# Patient Record
Sex: Male | Born: 1990 | Race: Black or African American | Hispanic: No | Marital: Single | Smoking: Current some day smoker
Health system: Southern US, Community
[De-identification: ages and names within clinical notes are randomized; demographics above are authoritative.]

## PROBLEM LIST (undated history)

## (undated) ENCOUNTER — Emergency Department (HOSPITAL_COMMUNITY): Admission: EM | Payer: Self-pay | Source: Home / Self Care

## (undated) DIAGNOSIS — J45909 Unspecified asthma, uncomplicated: Secondary | ICD-10-CM

## (undated) DIAGNOSIS — J40 Bronchitis, not specified as acute or chronic: Secondary | ICD-10-CM

---
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
## 2008-07-15 ENCOUNTER — Emergency Department (HOSPITAL_COMMUNITY): Admission: EM | Admit: 2008-07-15 | Discharge: 2008-07-15 | Payer: Self-pay | Admitting: Emergency Medicine

## 2010-04-08 ENCOUNTER — Emergency Department (HOSPITAL_COMMUNITY)
Admission: EM | Admit: 2010-04-08 | Discharge: 2010-04-08 | Disposition: A | Discharge status: Home / Self Care | Payer: Self-pay | Attending: Emergency Medicine | Admitting: Emergency Medicine

## 2010-04-08 ENCOUNTER — Emergency Department (HOSPITAL_COMMUNITY): Payer: Self-pay

## 2010-04-08 DIAGNOSIS — S62009A Unspecified fracture of navicular [scaphoid] bone of unspecified wrist, initial encounter for closed fracture: Secondary | ICD-10-CM | POA: Insufficient documentation

## 2012-04-29 ENCOUNTER — Emergency Department (HOSPITAL_COMMUNITY): Payer: No Typology Code available for payment source

## 2012-04-29 ENCOUNTER — Encounter (HOSPITAL_COMMUNITY): Payer: Self-pay

## 2012-04-29 ENCOUNTER — Emergency Department (HOSPITAL_COMMUNITY)
Admission: EM | Admit: 2012-04-29 | Discharge: 2012-04-29 | Disposition: A | Discharge status: Home / Self Care | Payer: No Typology Code available for payment source | Attending: Emergency Medicine | Admitting: Emergency Medicine

## 2012-04-29 DIAGNOSIS — T148XXA Other injury of unspecified body region, initial encounter: Secondary | ICD-10-CM | POA: Insufficient documentation

## 2012-04-29 DIAGNOSIS — F17200 Nicotine dependence, unspecified, uncomplicated: Secondary | ICD-10-CM | POA: Insufficient documentation

## 2012-04-29 DIAGNOSIS — S0083XA Contusion of other part of head, initial encounter: Secondary | ICD-10-CM

## 2012-04-29 DIAGNOSIS — Y939 Activity, unspecified: Secondary | ICD-10-CM | POA: Insufficient documentation

## 2012-04-29 DIAGNOSIS — J45909 Unspecified asthma, uncomplicated: Secondary | ICD-10-CM | POA: Insufficient documentation

## 2012-04-29 DIAGNOSIS — IMO0002 Reserved for concepts with insufficient information to code with codable children: Secondary | ICD-10-CM | POA: Insufficient documentation

## 2012-04-29 DIAGNOSIS — S0003XA Contusion of scalp, initial encounter: Secondary | ICD-10-CM | POA: Insufficient documentation

## 2012-04-29 DIAGNOSIS — Y92410 Unspecified street and highway as the place of occurrence of the external cause: Secondary | ICD-10-CM | POA: Insufficient documentation

## 2012-04-29 HISTORY — DX: Unspecified asthma, uncomplicated: J45.909

## 2012-04-29 MED ORDER — HYDROCODONE-ACETAMINOPHEN 5-325 MG PO TABS: 1.0000 | ORAL_TABLET | ORAL | Status: AC | PRN

## 2012-04-29 MED ORDER — HYDROCODONE-ACETAMINOPHEN 5-325 MG PO TABS
1.0000 | ORAL_TABLET | Freq: Once | ORAL | Status: AC
  Administered 2012-04-29: 1 via ORAL
  Filled 2012-04-29: qty 1

## 2012-04-29 MED ORDER — METHOCARBAMOL 500 MG PO TABS: 500.0000 mg | ORAL_TABLET | Freq: Three times a day (TID) | ORAL | Status: AC

## 2012-04-29 MED ORDER — METHOCARBAMOL 500 MG PO TABS
1000.0000 mg | ORAL_TABLET | Freq: Once | ORAL | Status: AC
  Administered 2012-04-29: 1000 mg via ORAL
  Filled 2012-04-29: qty 2

## 2012-04-29 MED ORDER — IBUPROFEN 800 MG PO TABS
800.0000 mg | ORAL_TABLET | Freq: Once | ORAL | Status: AC
  Administered 2012-04-29: 800 mg via ORAL
  Filled 2012-04-29: qty 1
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
## 2012-04-29 NOTE — ED Notes (Signed)
Pa bryant to see pt.  

## 2012-04-29 NOTE — ED Notes (Signed)
Pt resting in bed, awaiting xray results

## 2012-04-30 NOTE — ED Provider Notes (Signed)
Medical screening examination/treatment/procedure(s) were performed by non-physician practitioner and as supervising physician I was immediately available for consultation/collaboration.   Joya Gaskins, MD 04/30/12 [phone]

## 2013-08-12 ENCOUNTER — Emergency Department: Payer: Self-pay | Admitting: Emergency Medicine

## 2016-07-23 ENCOUNTER — Emergency Department (HOSPITAL_COMMUNITY)
Admission: EM | Admit: 2016-07-23 | Discharge: 2016-07-23 | Disposition: A | Discharge status: Home / Self Care | Payer: No Typology Code available for payment source | Attending: Emergency Medicine | Admitting: Emergency Medicine

## 2016-07-23 ENCOUNTER — Encounter (HOSPITAL_COMMUNITY): Payer: Self-pay | Admitting: *Deleted

## 2016-07-23 ENCOUNTER — Emergency Department (HOSPITAL_COMMUNITY): Payer: No Typology Code available for payment source

## 2016-07-23 DIAGNOSIS — S3992XA Unspecified injury of lower back, initial encounter: Secondary | ICD-10-CM | POA: Diagnosis present

## 2016-07-23 DIAGNOSIS — Y999 Unspecified external cause status: Secondary | ICD-10-CM | POA: Diagnosis not present

## 2016-07-23 DIAGNOSIS — Y939 Activity, unspecified: Secondary | ICD-10-CM | POA: Insufficient documentation

## 2016-07-23 DIAGNOSIS — M7918 Myalgia, other site: Secondary | ICD-10-CM

## 2016-07-23 DIAGNOSIS — M791 Myalgia: Secondary | ICD-10-CM | POA: Diagnosis not present

## 2016-07-23 DIAGNOSIS — F17200 Nicotine dependence, unspecified, uncomplicated: Secondary | ICD-10-CM | POA: Diagnosis not present

## 2016-07-23 DIAGNOSIS — Y92410 Unspecified street and highway as the place of occurrence of the external cause: Secondary | ICD-10-CM | POA: Diagnosis not present

## 2016-07-23 DIAGNOSIS — J45909 Unspecified asthma, uncomplicated: Secondary | ICD-10-CM | POA: Insufficient documentation

## 2016-07-23 DIAGNOSIS — S39012A Strain of muscle, fascia and tendon of lower back, initial encounter: Secondary | ICD-10-CM | POA: Diagnosis not present

## 2016-07-23 HISTORY — DX: Unspecified asthma, uncomplicated: J45.909

## 2016-07-23 HISTORY — DX: Bronchitis, not specified as acute or chronic: J40

## 2016-07-23 MED ORDER — IBUPROFEN 600 MG PO TABS: 600.0000 mg | ORAL_TABLET | Freq: Four times a day (QID) | ORAL | 0 refills | Status: DC | PRN

## 2016-07-23 MED ORDER — OXYCODONE-ACETAMINOPHEN 5-325 MG PO TABS
1.0000 | ORAL_TABLET | Freq: Once | ORAL | Status: AC
  Administered 2016-07-23: 1 via ORAL
  Filled 2016-07-23: qty 1

## 2016-07-23 MED ORDER — METHOCARBAMOL 500 MG PO TABS: 500.0000 mg | ORAL_TABLET | Freq: Two times a day (BID) | ORAL | 0 refills | Status: DC
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
## 2018-12-02 ENCOUNTER — Other Ambulatory Visit: Payer: Self-pay

## 2018-12-02 ENCOUNTER — Encounter (HOSPITAL_COMMUNITY): Payer: Self-pay | Admitting: Emergency Medicine

## 2018-12-02 ENCOUNTER — Emergency Department (HOSPITAL_COMMUNITY)
Admission: EM | Admit: 2018-12-02 | Discharge: 2018-12-02 | Disposition: A | Discharge status: Home / Self Care | Payer: Self-pay | Attending: Emergency Medicine | Admitting: Emergency Medicine

## 2018-12-02 DIAGNOSIS — S0181XA Laceration without foreign body of other part of head, initial encounter: Secondary | ICD-10-CM | POA: Insufficient documentation

## 2018-12-02 DIAGNOSIS — Y999 Unspecified external cause status: Secondary | ICD-10-CM | POA: Insufficient documentation

## 2018-12-02 DIAGNOSIS — F17210 Nicotine dependence, cigarettes, uncomplicated: Secondary | ICD-10-CM | POA: Insufficient documentation

## 2018-12-02 DIAGNOSIS — J45909 Unspecified asthma, uncomplicated: Secondary | ICD-10-CM | POA: Insufficient documentation

## 2018-12-02 DIAGNOSIS — W0110XA Fall on same level from slipping, tripping and stumbling with subsequent striking against unspecified object, initial encounter: Secondary | ICD-10-CM | POA: Insufficient documentation

## 2018-12-02 DIAGNOSIS — Z79899 Other long term (current) drug therapy: Secondary | ICD-10-CM | POA: Insufficient documentation

## 2018-12-02 DIAGNOSIS — Z20822 Contact with and (suspected) exposure to covid-19: Secondary | ICD-10-CM

## 2018-12-02 DIAGNOSIS — Y929 Unspecified place or not applicable: Secondary | ICD-10-CM | POA: Insufficient documentation

## 2018-12-02 DIAGNOSIS — Y939 Activity, unspecified: Secondary | ICD-10-CM | POA: Insufficient documentation

## 2018-12-02 MED ORDER — IBUPROFEN 600 MG PO TABS: 600.0000 mg | ORAL_TABLET | Freq: Four times a day (QID) | ORAL | 0 refills | Status: DC | PRN

## 2018-12-02 MED ORDER — LIDOCAINE HCL (PF) 1 % IJ SOLN
INTRAMUSCULAR | Status: AC
  Filled 2018-12-02: qty 10

## 2018-12-02 NOTE — Discharge Instructions (Signed)
You have 2 stitches in your wound, if you develop increasing pain swelling or fever come back to the hospital immediately.  Otherwise follow-up with your doctor in 1 week to have the stitches removed, ibuprofen as needed for pain, cold compresses.

## 2018-12-02 NOTE — ED Triage Notes (Signed)
Patient states he fell last night hitting the right side of his forehead. Patient has 1/2" laceration noted to forehead. Denies LOC.
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
## 2018-12-02 NOTE — ED Notes (Signed)
Sutured by Dr Sabra Heck

## 2018-12-03 LAB — NOVEL CORONAVIRUS, NAA: SARS-CoV-2, NAA: NOT DETECTED

## 2018-12-07 ENCOUNTER — Telehealth: Payer: Self-pay | Admitting: General Practice

## 2018-12-07 NOTE — Telephone Encounter (Signed)
Negative COVID results given. Patient results "NOT Detected." Caller expressed understanding. ° °

## 2019-03-18 ENCOUNTER — Other Ambulatory Visit: Payer: Self-pay

## 2019-03-18 ENCOUNTER — Ambulatory Visit: Payer: Self-pay | Attending: Internal Medicine

## 2019-03-18 DIAGNOSIS — Z20822 Contact with and (suspected) exposure to covid-19: Secondary | ICD-10-CM | POA: Insufficient documentation

## 2019-03-19 LAB — NOVEL CORONAVIRUS, NAA: SARS-CoV-2, NAA: NOT DETECTED

## 2019-08-09 ENCOUNTER — Ambulatory Visit: Payer: HRSA Program | Attending: Internal Medicine

## 2019-08-09 DIAGNOSIS — Z20822 Contact with and (suspected) exposure to covid-19: Secondary | ICD-10-CM | POA: Diagnosis not present

## 2019-08-10 LAB — NOVEL CORONAVIRUS, NAA: SARS-CoV-2, NAA: NOT DETECTED

## 2019-08-10 LAB — SARS-COV-2, NAA 2 DAY TAT

## 2020-10-22 ENCOUNTER — Emergency Department (HOSPITAL_COMMUNITY): Payer: No Typology Code available for payment source

## 2020-10-22 ENCOUNTER — Encounter (HOSPITAL_COMMUNITY): Payer: Self-pay

## 2020-10-22 ENCOUNTER — Inpatient Hospital Stay (HOSPITAL_COMMUNITY)
Admission: EM | Admit: 2020-10-22 | Discharge: 2020-10-30 | DRG: 140 | Disposition: A | Discharge status: Home / Self Care | Payer: No Typology Code available for payment source | Attending: Surgery | Admitting: Surgery

## 2020-10-22 DIAGNOSIS — T07XXXA Unspecified multiple injuries, initial encounter: Secondary | ICD-10-CM | POA: Diagnosis present

## 2020-10-22 DIAGNOSIS — S32409A Unspecified fracture of unspecified acetabulum, initial encounter for closed fracture: Secondary | ICD-10-CM | POA: Diagnosis present

## 2020-10-22 DIAGNOSIS — J189 Pneumonia, unspecified organism: Secondary | ICD-10-CM

## 2020-10-22 DIAGNOSIS — F17200 Nicotine dependence, unspecified, uncomplicated: Secondary | ICD-10-CM | POA: Diagnosis present

## 2020-10-22 DIAGNOSIS — T148XXA Other injury of unspecified body region, initial encounter: Secondary | ICD-10-CM

## 2020-10-22 DIAGNOSIS — R509 Fever, unspecified: Secondary | ICD-10-CM

## 2020-10-22 DIAGNOSIS — M25462 Effusion, left knee: Secondary | ICD-10-CM

## 2020-10-22 DIAGNOSIS — S0292XA Unspecified fracture of facial bones, initial encounter for closed fracture: Secondary | ICD-10-CM

## 2020-10-22 DIAGNOSIS — U071 COVID-19: Secondary | ICD-10-CM | POA: Diagnosis present

## 2020-10-22 DIAGNOSIS — S02609A Fracture of mandible, unspecified, initial encounter for closed fracture: Secondary | ICD-10-CM | POA: Diagnosis present

## 2020-10-22 DIAGNOSIS — Z419 Encounter for procedure for purposes other than remedying health state, unspecified: Secondary | ICD-10-CM

## 2020-10-22 DIAGNOSIS — F1910 Other psychoactive substance abuse, uncomplicated: Secondary | ICD-10-CM | POA: Diagnosis present

## 2020-10-22 DIAGNOSIS — T1490XA Injury, unspecified, initial encounter: Secondary | ICD-10-CM

## 2020-10-22 DIAGNOSIS — R52 Pain, unspecified: Secondary | ICD-10-CM

## 2020-10-22 DIAGNOSIS — S02401A Maxillary fracture, unspecified, initial encounter for closed fracture: Secondary | ICD-10-CM | POA: Diagnosis present

## 2020-10-22 DIAGNOSIS — F17210 Nicotine dependence, cigarettes, uncomplicated: Secondary | ICD-10-CM | POA: Diagnosis present

## 2020-10-22 DIAGNOSIS — S0240FA Zygomatic fracture, left side, initial encounter for closed fracture: Secondary | ICD-10-CM | POA: Diagnosis present

## 2020-10-22 DIAGNOSIS — S0266XA Fracture of symphysis of mandible, initial encounter for closed fracture: Principal | ICD-10-CM | POA: Diagnosis present

## 2020-10-22 DIAGNOSIS — E559 Vitamin D deficiency, unspecified: Secondary | ICD-10-CM | POA: Diagnosis present

## 2020-10-22 DIAGNOSIS — F10129 Alcohol abuse with intoxication, unspecified: Secondary | ICD-10-CM | POA: Diagnosis present

## 2020-10-22 DIAGNOSIS — Z91041 Radiographic dye allergy status: Secondary | ICD-10-CM | POA: Diagnosis not present

## 2020-10-22 DIAGNOSIS — F1490 Cocaine use, unspecified, uncomplicated: Secondary | ICD-10-CM | POA: Diagnosis present

## 2020-10-22 DIAGNOSIS — Z23 Encounter for immunization: Secondary | ICD-10-CM | POA: Diagnosis not present

## 2020-10-22 DIAGNOSIS — M898X9 Other specified disorders of bone, unspecified site: Secondary | ICD-10-CM | POA: Diagnosis present

## 2020-10-22 DIAGNOSIS — J690 Pneumonitis due to inhalation of food and vomit: Secondary | ICD-10-CM | POA: Diagnosis not present

## 2020-10-22 DIAGNOSIS — D62 Acute posthemorrhagic anemia: Secondary | ICD-10-CM | POA: Diagnosis present

## 2020-10-22 DIAGNOSIS — S32462A Displaced associated transverse-posterior fracture of left acetabulum, initial encounter for closed fracture: Secondary | ICD-10-CM | POA: Diagnosis present

## 2020-10-22 DIAGNOSIS — Y905 Blood alcohol level of 100-119 mg/100 ml: Secondary | ICD-10-CM | POA: Diagnosis present

## 2020-10-22 DIAGNOSIS — Y92410 Unspecified street and highway as the place of occurrence of the external cause: Secondary | ICD-10-CM | POA: Diagnosis not present

## 2020-10-22 DIAGNOSIS — Z8616 Personal history of COVID-19: Secondary | ICD-10-CM

## 2020-10-22 HISTORY — DX: Unspecified asthma, uncomplicated: J45.909

## 2020-10-22 HISTORY — DX: Personal history of COVID-19: Z86.16

## 2020-10-22 LAB — URINALYSIS, ROUTINE W REFLEX MICROSCOPIC
Bacteria, UA: NONE SEEN
Bilirubin Urine: NEGATIVE
Glucose, UA: NEGATIVE mg/dL
Ketones, ur: NEGATIVE mg/dL
Leukocytes,Ua: NEGATIVE
Nitrite: NEGATIVE
Protein, ur: NEGATIVE mg/dL
RBC / HPF: >50 RBC/hpf — ABNORMAL HIGH (ref 0–5)
Specific Gravity, Urine: 1.01 (ref 1.005–1.030)
pH: 6 (ref 5.0–8.0)

## 2020-10-22 LAB — COMPREHENSIVE METABOLIC PANEL
ALT: 21 U/L (ref 0–44)
ALT: 26 U/L (ref 0–44)
AST: 48 U/L — ABNORMAL HIGH (ref 15–41)
AST: 73 U/L — ABNORMAL HIGH (ref 15–41)
Albumin: 3.6 g/dL (ref 3.5–5.0)
Albumin: 3.4 g/dL — ABNORMAL LOW (ref 3.5–5.0)
Alkaline Phosphatase: 60 U/L (ref 38–126)
Alkaline Phosphatase: 54 U/L (ref 38–126)
Anion gap: 12 (ref 5–15)
Anion gap: 15 (ref 5–15)
BUN: 10 mg/dL (ref 6–20)
BUN: 10 mg/dL (ref 6–20)
CO2: 24 mmol/L (ref 22–32)
CO2: 19 mmol/L — ABNORMAL LOW (ref 22–32)
Calcium: 9.1 mg/dL (ref 8.9–10.3)
Calcium: 8.5 mg/dL — ABNORMAL LOW (ref 8.9–10.3)
Chloride: 100 mmol/L (ref 98–111)
Chloride: 103 mmol/L (ref 98–111)
Creatinine, Ser: 1.39 mg/dL — ABNORMAL HIGH (ref 0.61–1.24)
Creatinine, Ser: 1.18 mg/dL (ref 0.61–1.24)
GFR, Estimated: >60 mL/min (ref >60)
GFR, Estimated: >60 mL/min (ref >60)
Glucose, Bld: 95 mg/dL (ref 70–99)
Glucose, Bld: 81 mg/dL (ref 70–99)
Potassium: 4.9 mmol/L (ref 3.5–5.1)
Potassium: 4.6 mmol/L (ref 3.5–5.1)
Sodium: 136 mmol/L (ref 135–145)
Sodium: 137 mmol/L (ref 135–145)
Total Bilirubin: 0.5 mg/dL (ref 0.3–1.2)
Total Bilirubin: 0.3 mg/dL (ref 0.3–1.2)
Total Protein: 6.7 g/dL (ref 6.5–8.1)
Total Protein: 6.3 g/dL — ABNORMAL LOW (ref 6.5–8.1)

## 2020-10-22 LAB — PROTIME-INR
INR: 1.1 (ref 0.8–1.2)
Prothrombin Time: 13.7 seconds (ref 11.4–15.2)

## 2020-10-22 LAB — RESP PANEL BY RT-PCR (FLU A&B, COVID) ARPGX2
Influenza A by PCR: NEGATIVE
Influenza B by PCR: NEGATIVE
SARS Coronavirus 2 by RT PCR: POSITIVE — AB

## 2020-10-22 LAB — I-STAT CHEM 8, ED
BUN: 11 mg/dL (ref 6–20)
BUN: 11 mg/dL (ref 6–20)
Calcium, Ion: 1 mmol/L — ABNORMAL LOW (ref 1.15–1.40)
Calcium, Ion: 1.06 mmol/L — ABNORMAL LOW (ref 1.15–1.40)
Chloride: 102 mmol/L (ref 98–111)
Chloride: 103 mmol/L (ref 98–111)
Creatinine, Ser: 1.5 mg/dL — ABNORMAL HIGH (ref 0.61–1.24)
Creatinine, Ser: 1.5 mg/dL — ABNORMAL HIGH (ref 0.61–1.24)
Glucose, Bld: 91 mg/dL (ref 70–99)
Glucose, Bld: 91 mg/dL (ref 70–99)
HCT: 46 % (ref 39.0–52.0)
HCT: 47 % (ref 39.0–52.0)
Hemoglobin: 15.6 g/dL (ref 13.0–17.0)
Hemoglobin: 16 g/dL (ref 13.0–17.0)
Potassium: 4.8 mmol/L (ref 3.5–5.1)
Potassium: 5 mmol/L (ref 3.5–5.1)
Sodium: 136 mmol/L (ref 135–145)
Sodium: 137 mmol/L (ref 135–145)
TCO2: 25 mmol/L (ref 22–32)
TCO2: 26 mmol/L (ref 22–32)

## 2020-10-22 LAB — CBC
HCT: 44.7 % (ref 39.0–52.0)
Hemoglobin: 15.2 g/dL (ref 13.0–17.0)
MCH: 33.9 pg (ref 26.0–34.0)
MCHC: 34 g/dL (ref 30.0–36.0)
MCV: 99.6 fL (ref 80.0–100.0)
Platelets: 303 10*3/uL (ref 150–400)
RBC: 4.49 MIL/uL (ref 4.22–5.81)
RDW: 12.6 % (ref 11.5–15.5)
WBC: 22.5 10*3/uL — ABNORMAL HIGH (ref 4.0–10.5)
nRBC: 0 % (ref 0.0–0.2)

## 2020-10-22 LAB — ETHANOL: Alcohol, Ethyl (B): 110 mg/dL — ABNORMAL HIGH (ref <10)

## 2020-10-22 LAB — SAMPLE TO BLOOD BANK

## 2020-10-22 LAB — HIV ANTIBODY (ROUTINE TESTING W REFLEX): HIV Screen 4th Generation wRfx: NONREACTIVE

## 2020-10-22 LAB — LACTIC ACID, PLASMA: Lactic Acid, Venous: 2.5 mmol/L (ref 0.5–1.9)

## 2020-10-22 MED ORDER — DOCUSATE SODIUM 100 MG PO CAPS
100.0000 mg | ORAL_CAPSULE | Freq: Two times a day (BID) | ORAL | Status: DC
  Administered 2020-10-22 – 2020-10-27 (×6): 100 mg via ORAL
  Filled 2020-10-22 (×7): qty 1

## 2020-10-22 MED ORDER — ONDANSETRON 4 MG PO TBDP: 4.0000 mg | ORAL_TABLET | Freq: Four times a day (QID) | ORAL | Status: DC | PRN

## 2020-10-22 MED ORDER — SODIUM CHLORIDE 0.9 % IV BOLUS
1000.0000 mL | Freq: Once | INTRAVENOUS | Status: AC
  Administered 2020-10-22: 1000 mL via INTRAVENOUS

## 2020-10-22 MED ORDER — HYDROMORPHONE HCL 1 MG/ML IJ SOLN: INTRAMUSCULAR | Status: DC | PRN

## 2020-10-22 MED ORDER — ONDANSETRON HCL 4 MG/2ML IJ SOLN
INTRAMUSCULAR | Status: AC
  Administered 2020-10-22: 4 mg via INTRAVENOUS
  Filled 2020-10-22: qty 2

## 2020-10-22 MED ORDER — HYDROMORPHONE HCL 1 MG/ML IJ SOLN
0.5000 mg | INTRAMUSCULAR | Status: DC | PRN
Start: 2020-10-22 — End: 2020-10-30
  Administered 2020-10-22 – 2020-10-30 (×20): 0.5 mg via INTRAVENOUS
  Filled 2020-10-22 (×22): qty 1

## 2020-10-22 MED ORDER — SODIUM CHLORIDE 0.9 % IV SOLN
INTRAVENOUS | Status: DC | PRN
  Administered 2020-10-22: 1000 mL via INTRAVENOUS

## 2020-10-22 MED ORDER — ENOXAPARIN SODIUM 30 MG/0.3ML IJ SOSY: 30.0000 mg | PREFILLED_SYRINGE | Freq: Two times a day (BID) | INTRAMUSCULAR | Status: DC

## 2020-10-22 MED ORDER — ORAL CARE MOUTH RINSE
15.0000 mL | Freq: Two times a day (BID) | OROMUCOSAL | Status: DC
  Administered 2020-10-22 – 2020-10-30 (×11): 15 mL via OROMUCOSAL

## 2020-10-22 MED ORDER — PROPOFOL 10 MG/ML IV BOLUS
1.0000 mg/kg | Freq: Once | INTRAVENOUS | Status: DC
  Filled 2020-10-22: qty 20

## 2020-10-22 MED ORDER — LACTATED RINGERS IV SOLN: INTRAVENOUS | Status: DC

## 2020-10-22 MED ORDER — ACETAMINOPHEN 325 MG PO TABS
650.0000 mg | ORAL_TABLET | Freq: Four times a day (QID) | ORAL | Status: DC
  Administered 2020-10-22 – 2020-10-24 (×8): 650 mg via ORAL
  Filled 2020-10-22 (×9): qty 2

## 2020-10-22 MED ORDER — NICOTINE 7 MG/24HR TD PT24
7.0000 mg | MEDICATED_PATCH | Freq: Every day | TRANSDERMAL | Status: DC
  Administered 2020-10-22 – 2020-10-23 (×2): 7 mg via TRANSDERMAL
  Filled 2020-10-22 (×2): qty 1

## 2020-10-22 MED ORDER — HYDROMORPHONE HCL 1 MG/ML IJ SOLN
0.5000 mg | Freq: Once | INTRAMUSCULAR | Status: AC
Start: 2020-10-22 — End: 2020-10-22
  Administered 2020-10-22: 0.5 mg via INTRAVENOUS
  Filled 2020-10-22: qty 1

## 2020-10-22 MED ORDER — ONDANSETRON HCL 4 MG/2ML IJ SOLN: 4.0000 mg | Freq: Once | INTRAMUSCULAR | Status: AC

## 2020-10-22 MED ORDER — ONDANSETRON HCL 4 MG/2ML IJ SOLN: 4.0000 mg | Freq: Four times a day (QID) | INTRAMUSCULAR | Status: DC | PRN

## 2020-10-22 MED ORDER — KETAMINE HCL 50 MG/5ML IJ SOSY
20.0000 mg | PREFILLED_SYRINGE | Freq: Once | INTRAMUSCULAR | Status: AC
  Administered 2020-10-22: 20 mg via INTRAVENOUS
  Filled 2020-10-22: qty 5

## 2020-10-22 MED ORDER — HYDROMORPHONE HCL 1 MG/ML IJ SOLN
1.0000 mg | Freq: Once | INTRAMUSCULAR | Status: AC
  Administered 2020-10-22: 1 mg via INTRAVENOUS
  Filled 2020-10-22: qty 1

## 2020-10-22 MED ORDER — TETANUS-DIPHTH-ACELL PERTUSSIS 5-2.5-18.5 LF-MCG/0.5 IM SUSY
0.5000 mL | PREFILLED_SYRINGE | Freq: Once | INTRAMUSCULAR | Status: AC
  Administered 2020-10-22: 0.5 mL via INTRAMUSCULAR
  Filled 2020-10-22: qty 0.5

## 2020-10-22 MED ORDER — PROPOFOL 10 MG/ML IV BOLUS
INTRAVENOUS | Status: AC | PRN
Start: 2020-10-22 — End: 2020-10-22
  Administered 2020-10-22 (×2): 40 mg via INTRAVENOUS

## 2020-10-22 MED ORDER — OXYCODONE HCL 5 MG PO TABS
5.0000 mg | ORAL_TABLET | ORAL | Status: DC | PRN
  Administered 2020-10-22 – 2020-10-25 (×12): 10 mg via ORAL
  Filled 2020-10-22 (×13): qty 2

## 2020-10-22 NOTE — ED Notes (Signed)
Received verbal report from Crystal B RN at this time 
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
## 2020-10-22 NOTE — ED Notes (Signed)
Verbal report given to [place] B RN

## 2020-10-22 NOTE — ED Notes (Signed)
Attempted to call report advised charge nurse not approved to call back

## 2020-10-22 NOTE — ED Notes (Signed)
Labs redrawn and sent to lab. Attempt x3. Pt tolerated well. Pt continues to move around in bed, pt instructed to remain still on back. Pt continues to move.
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
## 2020-10-22 NOTE — ED Notes (Signed)
Dr. Freida Busman arrived for trauma

## 2020-10-22 NOTE — ED Notes (Signed)
Pt to CT

## 2020-10-22 NOTE — ED Notes (Signed)
Placed Breakfast Order 

## 2020-10-22 NOTE — Sedation Documentation (Signed)
Post reduction x-ray confirmed L hip reduction per Dr. Pilar Plate
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
## 2020-10-22 NOTE — Progress Notes (Signed)
Orthopedic Tech Progress Note Patient Details:  Andrew Simon Aug 01, 1990 [id number]  MD did a reduction of the HIP and I was given a verbal order to apply a KNEE IMMOBILIZER   Ortho Devices Type of Ortho Device: Knee Immobilizer Ortho Device/Splint Location: LLE Ortho Device/Splint Interventions: Application, Adjustment   Post Interventions Patient Tolerated: Well Instructions Provided: Care of device  [name] 10/22/2020, 3:42 AM

## 2020-10-22 NOTE — Progress Notes (Signed)
Consulted by EDP for L acetab fx/dislocation. EDP plans for closed reduction in ED. After successful reduction, patient will need CT L hip for surgical planning. Will need surgery some time with week with Ortho Trauma. Full consult to follow.

## 2020-10-23 ENCOUNTER — Encounter (HOSPITAL_COMMUNITY): Payer: Self-pay | Admitting: Certified Registered Nurse Anesthetist

## 2020-10-23 ENCOUNTER — Encounter (HOSPITAL_COMMUNITY): Admission: EM | Disposition: A | Discharge status: Home / Self Care | Payer: Self-pay | Source: Home / Self Care

## 2020-10-23 ENCOUNTER — Inpatient Hospital Stay (HOSPITAL_COMMUNITY): Payer: No Typology Code available for payment source

## 2020-10-23 ENCOUNTER — Encounter (HOSPITAL_COMMUNITY): Payer: Self-pay

## 2020-10-23 DIAGNOSIS — U071 COVID-19: Secondary | ICD-10-CM | POA: Diagnosis present

## 2020-10-23 DIAGNOSIS — R509 Fever, unspecified: Secondary | ICD-10-CM | POA: Diagnosis not present

## 2020-10-23 DIAGNOSIS — F1910 Other psychoactive substance abuse, uncomplicated: Secondary | ICD-10-CM | POA: Diagnosis present

## 2020-10-23 DIAGNOSIS — F17200 Nicotine dependence, unspecified, uncomplicated: Secondary | ICD-10-CM | POA: Diagnosis present

## 2020-10-23 DIAGNOSIS — T07XXXA Unspecified multiple injuries, initial encounter: Secondary | ICD-10-CM

## 2020-10-23 DIAGNOSIS — R5081 Fever presenting with conditions classified elsewhere: Secondary | ICD-10-CM

## 2020-10-23 DIAGNOSIS — S02602A Fracture of unspecified part of body of left mandible, initial encounter for closed fracture: Secondary | ICD-10-CM

## 2020-10-23 DIAGNOSIS — S0240FA Zygomatic fracture, left side, initial encounter for closed fracture: Secondary | ICD-10-CM

## 2020-10-23 DIAGNOSIS — F17210 Nicotine dependence, cigarettes, uncomplicated: Secondary | ICD-10-CM

## 2020-10-23 DIAGNOSIS — S0240DA Maxillary fracture, left side, initial encounter for closed fracture: Secondary | ICD-10-CM

## 2020-10-23 LAB — GLUCOSE, CAPILLARY: Glucose-Capillary: 210 mg/dL — ABNORMAL HIGH (ref 70–99)

## 2020-10-23 LAB — BASIC METABOLIC PANEL
Anion gap: 6 (ref 5–15)
BUN: 7 mg/dL (ref 6–20)
CO2: 26 mmol/L (ref 22–32)
Calcium: 8.6 mg/dL — ABNORMAL LOW (ref 8.9–10.3)
Chloride: 102 mmol/L (ref 98–111)
Creatinine, Ser: 1.16 mg/dL (ref 0.61–1.24)
GFR, Estimated: >60 mL/min (ref >60)
Glucose, Bld: 104 mg/dL — ABNORMAL HIGH (ref 70–99)
Potassium: 4.2 mmol/L (ref 3.5–5.1)
Sodium: 134 mmol/L — ABNORMAL LOW (ref 135–145)

## 2020-10-23 LAB — RAPID URINE DRUG SCREEN, HOSP PERFORMED
Amphetamines: NOT DETECTED
Barbiturates: NOT DETECTED
Benzodiazepines: NOT DETECTED
Cocaine: POSITIVE — AB
Opiates: POSITIVE — AB
Tetrahydrocannabinol: POSITIVE — AB

## 2020-10-23 LAB — PROCALCITONIN: Procalcitonin: 0.47 ng/mL

## 2020-10-23 SURGERY — OPEN REDUCTION INTERNAL FIXATION (ORIF) MANDIBULAR FRACTURE
Anesthesia: General

## 2020-10-23 MED ORDER — WHITE PETROLATUM EX OINT
TOPICAL_OINTMENT | CUTANEOUS | Status: AC
  Filled 2020-10-23: qty 28.35

## 2020-10-23 MED ORDER — FENTANYL CITRATE (PF) 250 MCG/5ML IJ SOLN
INTRAMUSCULAR | Status: AC
  Filled 2020-10-23: qty 5

## 2020-10-23 MED ORDER — LOPERAMIDE HCL 2 MG PO CAPS: 2.0000 mg | ORAL_CAPSULE | ORAL | Status: AC | PRN

## 2020-10-23 MED ORDER — PROPOFOL 10 MG/ML IV BOLUS
INTRAVENOUS | Status: AC
  Filled 2020-10-23: qty 40

## 2020-10-23 MED ORDER — HYDROXYZINE HCL 25 MG PO TABS
25.0000 mg | ORAL_TABLET | Freq: Four times a day (QID) | ORAL | Status: AC | PRN
  Administered 2020-10-24 – 2020-10-27 (×8): 25 mg via ORAL
  Filled 2020-10-23 (×9): qty 1

## 2020-10-23 MED ORDER — LORAZEPAM 2 MG/ML IJ SOLN
1.0000 mg | INTRAMUSCULAR | Status: AC | PRN
  Administered 2020-10-24: 2 mg via INTRAVENOUS
  Filled 2020-10-23: qty 1

## 2020-10-23 MED ORDER — DICYCLOMINE HCL 20 MG PO TABS
20.0000 mg | ORAL_TABLET | Freq: Four times a day (QID) | ORAL | Status: AC | PRN
  Filled 2020-10-23: qty 1

## 2020-10-23 MED ORDER — MIDAZOLAM HCL 2 MG/2ML IJ SOLN
INTRAMUSCULAR | Status: AC
  Filled 2020-10-23: qty 2

## 2020-10-23 MED ORDER — FOLIC ACID 1 MG PO TABS
1.0000 mg | ORAL_TABLET | Freq: Every day | ORAL | Status: DC
  Administered 2020-10-23 – 2020-10-30 (×8): 1 mg via ORAL
  Filled 2020-10-23 (×7): qty 1

## 2020-10-23 MED ORDER — METHOCARBAMOL 500 MG PO TABS
500.0000 mg | ORAL_TABLET | Freq: Three times a day (TID) | ORAL | Status: DC | PRN
  Administered 2020-10-23 – 2020-10-27 (×7): 500 mg via ORAL
  Filled 2020-10-23 (×8): qty 1

## 2020-10-23 MED ORDER — NICOTINE 14 MG/24HR TD PT24
14.0000 mg | MEDICATED_PATCH | Freq: Every day | TRANSDERMAL | Status: DC
  Administered 2020-10-24: 14 mg via TRANSDERMAL
  Filled 2020-10-23 (×2): qty 1

## 2020-10-23 MED ORDER — THIAMINE HCL 100 MG PO TABS
100.0000 mg | ORAL_TABLET | Freq: Every day | ORAL | Status: DC
  Administered 2020-10-23 – 2020-10-30 (×6): 100 mg via ORAL
  Filled 2020-10-23 (×5): qty 1

## 2020-10-23 MED ORDER — THIAMINE HCL 100 MG/ML IJ SOLN
100.0000 mg | Freq: Every day | INTRAMUSCULAR | Status: DC
  Administered 2020-10-28 – 2020-10-29 (×2): 100 mg via INTRAVENOUS
  Filled 2020-10-23 (×4): qty 2

## 2020-10-23 MED ORDER — MUPIROCIN 2 % EX OINT
1.0000 "application " | TOPICAL_OINTMENT | Freq: Two times a day (BID) | CUTANEOUS | Status: AC
  Administered 2020-10-23 – 2020-10-28 (×10): 1 via NASAL
  Filled 2020-10-23 (×3): qty 22

## 2020-10-23 MED ORDER — LORAZEPAM 1 MG PO TABS: 1.0000 mg | ORAL_TABLET | ORAL | Status: AC | PRN

## 2020-10-23 MED ORDER — ADULT MULTIVITAMIN W/MINERALS CH
1.0000 | ORAL_TABLET | Freq: Every day | ORAL | Status: DC
  Administered 2020-10-23 – 2020-10-30 (×8): 1 via ORAL
  Filled 2020-10-23 (×7): qty 1

## 2020-10-23 SURGICAL SUPPLY — 38 items
BLADE SURG 15 STRL LF DISP TIS (BLADE) IMPLANT
BLADE SURG 15 STRL SS (BLADE)
CANISTER SUCT 3000ML PPV (MISCELLANEOUS) ×3 IMPLANT
CLEANER TIP ELECTROSURG 2X2 (MISCELLANEOUS) ×3 IMPLANT
DRAPE HALF SHEET 40X57 (DRAPES) IMPLANT
ELECT COATED BLADE 2.86 ST (ELECTRODE) ×3 IMPLANT
ELECT NDL TIP 2.8 STRL (NEEDLE) IMPLANT
ELECT NEEDLE TIP 2.8 STRL (NEEDLE) IMPLANT
ELECT REM PT RETURN 9FT ADLT (ELECTROSURGICAL) ×2
ELECTRODE REM PT RTRN 9FT ADLT (ELECTROSURGICAL) ×2 IMPLANT
GLOVE SURG ENC MOIS LTX SZ7 (GLOVE) ×3 IMPLANT
GOWN STRL REUS W/ TWL LRG LVL3 (GOWN DISPOSABLE) ×4 IMPLANT
GOWN STRL REUS W/TWL LRG LVL3 (GOWN DISPOSABLE) ×4
KIT BASIN OR (CUSTOM PROCEDURE TRAY) ×3 IMPLANT
KIT TURNOVER KIT B (KITS) ×3 IMPLANT
NDL HYPO 25GX1X1/2 BEV (NEEDLE) IMPLANT
NEEDLE HYPO 25GX1X1/2 BEV (NEEDLE) IMPLANT
NS IRRIG 1000ML POUR BTL (IV SOLUTION) ×3 IMPLANT
PAD ARMBOARD 7.5X6 YLW CONV (MISCELLANEOUS) ×6 IMPLANT
PATTIES SURGICAL .5 X3 (DISPOSABLE) IMPLANT
PENCIL SMOKE EVACUATOR (MISCELLANEOUS) ×3 IMPLANT
SCISSORS WIRE ANG 4 3/4 DISP (INSTRUMENTS) ×3 IMPLANT
STAPLER VISISTAT 35W (STAPLE) ×3 IMPLANT
SUT BONE WAX W31G (SUTURE) IMPLANT
SUT CHROMIC 3 0 SH 27 (SUTURE) ×3 IMPLANT
SUT ETHILON 3 0 PS 1 (SUTURE) IMPLANT
SUT SILK 3 0 (SUTURE)
SUT SILK 3 0 SH 30 (SUTURE) ×3 IMPLANT
SUT SILK 3-0 18XBRD TIE 12 (SUTURE) IMPLANT
SUT STEEL 0 (SUTURE)
SUT STEEL 0 18XMFL TIE 17 (SUTURE) IMPLANT
SUT STEEL 2 (SUTURE) IMPLANT
SUT VIC AB 3-0 FS2 27 (SUTURE) IMPLANT
SUT VIC AB 4-0 P-3 18X BRD (SUTURE) IMPLANT
SUT VIC AB 4-0 P3 18 (SUTURE)
TOWEL GREEN STERILE FF (TOWEL DISPOSABLE) ×3 IMPLANT
TRAY ENT MC OR (CUSTOM PROCEDURE TRAY) ×3 IMPLANT
WATER STERILE IRR 1000ML POUR (IV SOLUTION) ×3 IMPLANT
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
## 2020-10-24 ENCOUNTER — Inpatient Hospital Stay (HOSPITAL_COMMUNITY): Payer: No Typology Code available for payment source

## 2020-10-24 ENCOUNTER — Encounter (HOSPITAL_COMMUNITY): Admission: EM | Disposition: A | Discharge status: Home / Self Care | Payer: Self-pay | Source: Home / Self Care

## 2020-10-24 ENCOUNTER — Encounter (HOSPITAL_COMMUNITY): Payer: Self-pay

## 2020-10-24 ENCOUNTER — Encounter (HOSPITAL_COMMUNITY): Payer: Self-pay | Admitting: Otolaryngology

## 2020-10-24 LAB — BASIC METABOLIC PANEL
Anion gap: 5 (ref 5–15)
BUN: 7 mg/dL (ref 6–20)
CO2: 27 mmol/L (ref 22–32)
Calcium: 8.7 mg/dL — ABNORMAL LOW (ref 8.9–10.3)
Chloride: 105 mmol/L (ref 98–111)
Creatinine, Ser: 1.19 mg/dL (ref 0.61–1.24)
GFR, Estimated: >60 mL/min (ref >60)
Glucose, Bld: 95 mg/dL (ref 70–99)
Potassium: 4.6 mmol/L (ref 3.5–5.1)
Sodium: 137 mmol/L (ref 135–145)

## 2020-10-24 LAB — CBC
HCT: 38.5 % — ABNORMAL LOW (ref 39.0–52.0)
Hemoglobin: 13.2 g/dL (ref 13.0–17.0)
MCH: 33.3 pg (ref 26.0–34.0)
MCHC: 34.3 g/dL (ref 30.0–36.0)
MCV: 97.2 fL (ref 80.0–100.0)
Platelets: 253 10*3/uL (ref 150–400)
RBC: 3.96 MIL/uL — ABNORMAL LOW (ref 4.22–5.81)
RDW: 12.5 % (ref 11.5–15.5)
WBC: 15.4 10*3/uL — ABNORMAL HIGH (ref 4.0–10.5)
nRBC: 0 % (ref 0.0–0.2)

## 2020-10-24 LAB — HEPATITIS PANEL, ACUTE
HCV Ab: NONREACTIVE
Hep A IgM: NONREACTIVE
Hep B C IgM: NONREACTIVE
Hepatitis B Surface Ag: NONREACTIVE

## 2020-10-24 LAB — SURGICAL PCR SCREEN
MRSA, PCR: POSITIVE — AB
Staphylococcus aureus: POSITIVE — AB

## 2020-10-24 LAB — PROCALCITONIN: Procalcitonin: 0.2 ng/mL

## 2020-10-24 SURGERY — OPEN REDUCTION INTERNAL FIXATION (ORIF) ACETABULAR FRACTURE
Anesthesia: General | Site: Shoulder | Laterality: Left

## 2020-10-24 MED ORDER — SODIUM CHLORIDE 0.9 % IV SOLN
1.5000 g | Freq: Three times a day (TID) | INTRAVENOUS | Status: DC
  Administered 2020-10-24 – 2020-10-27 (×9): 1.5 g via INTRAVENOUS
  Filled 2020-10-24 (×12): qty 4

## 2020-10-24 MED ORDER — IOHEXOL 350 MG/ML SOLN
50.0000 mL | Freq: Once | INTRAVENOUS | Status: AC | PRN
  Administered 2020-10-24: 50 mL via INTRAVENOUS

## 2020-10-24 MED ORDER — ACETAMINOPHEN 500 MG PO TABS
1000.0000 mg | ORAL_TABLET | Freq: Four times a day (QID) | ORAL | Status: DC
  Administered 2020-10-24 – 2020-10-27 (×11): 1000 mg via ORAL
  Filled 2020-10-24 (×11): qty 2
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
## 2020-10-24 NOTE — Progress Notes (Signed)
Patient ID: Andrew Simon, male   DOB: 1990/11/10, 30 y.o.   MRN: [id number] I met with his mother for a clinical update. I answered her questions. I also D/W the [facility] team.  Georganna Skeans, MD, MPH, FACS Please use [email or web address] to contact on call provider
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
## 2020-10-24 NOTE — Progress Notes (Addendum)
Brief update  Chest x-ray is clear however CT chest to rule out PE shows developing possible pneumonia Will get a procalcitonin and it appears Unasyn has been started by ENT  Follow blood cultures for completion sake  We will continue to follow as he may be developing hypoxia from COVID perspective-, as such I will obtain dimer, CRP and ferritin in a.m.  His Covid cycle threshold is 40 which indicates his disease isnt aggressive  We will follow, but if stable without fever may sign off after visit 9/7    Pleas Koch, MD [facility] 2:46 PM
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
## 2020-10-25 ENCOUNTER — Inpatient Hospital Stay (HOSPITAL_COMMUNITY): Payer: No Typology Code available for payment source

## 2020-10-25 LAB — CBC
HCT: 31.2 % — ABNORMAL LOW (ref 39.0–52.0)
Hemoglobin: 10.9 g/dL — ABNORMAL LOW (ref 13.0–17.0)
MCH: 33.7 pg (ref 26.0–34.0)
MCHC: 34.9 g/dL (ref 30.0–36.0)
MCV: 96.6 fL (ref 80.0–100.0)
Platelets: 226 10*3/uL (ref 150–400)
RBC: 3.23 MIL/uL — ABNORMAL LOW (ref 4.22–5.81)
RDW: 12.3 % (ref 11.5–15.5)
WBC: 12.2 10*3/uL — ABNORMAL HIGH (ref 4.0–10.5)
nRBC: 0 % (ref 0.0–0.2)

## 2020-10-25 LAB — C-REACTIVE PROTEIN: CRP: 10.4 mg/dL — ABNORMAL HIGH (ref <1.0)

## 2020-10-25 LAB — D-DIMER, QUANTITATIVE: D-Dimer, Quant: 3.82 ug/mL-FEU — ABNORMAL HIGH (ref 0.00–0.50)

## 2020-10-25 LAB — BASIC METABOLIC PANEL
Anion gap: 7 (ref 5–15)
BUN: 6 mg/dL (ref 6–20)
CO2: 24 mmol/L (ref 22–32)
Calcium: 8.3 mg/dL — ABNORMAL LOW (ref 8.9–10.3)
Chloride: 104 mmol/L (ref 98–111)
Creatinine, Ser: 0.97 mg/dL (ref 0.61–1.24)
GFR, Estimated: >60 mL/min (ref >60)
Glucose, Bld: 95 mg/dL (ref 70–99)
Potassium: 4.2 mmol/L (ref 3.5–5.1)
Sodium: 135 mmol/L (ref 135–145)

## 2020-10-25 LAB — PROCALCITONIN: Procalcitonin: 0.2 ng/mL

## 2020-10-25 MED ORDER — OXYCODONE HCL 5 MG PO TABS
10.0000 mg | ORAL_TABLET | ORAL | Status: DC | PRN
  Administered 2020-10-25 – 2020-10-27 (×8): 15 mg via ORAL
  Filled 2020-10-25 (×8): qty 3

## 2020-10-25 NOTE — TOC CAGE-AID Note (Signed)
Transition of Care [facility] : [facility]) - CAGE-AID Screening   Patient Details  Name: Andrew Simon MRN: [id number] Date of Birth: 19-Aug-1990  Transition [facility]) CM/SW Contact:    Luisa Louk C Tarpley-Carter, LCSWA Phone Number: 10/25/2020, 9:36 AM   Clinical Narrative: Pt refused to participate in Cage Aid.  Stating his mouth hurts and he is in need of medication.  Pt is not willing to speak with CSW at this time.  Nethra Mehlberg Tarpley-Carter, MSW, LCSW-A Pronouns:  She/Her/Hers [facility]itions [facility] Care Clinical Social Worker Direct Number:  [phone] [email or web address]    CAGE-AID Screening: Substance Abuse Screening unable to be completed due to: : Patient Refused (Stating his mouth hurts and he is in need of medication.  Pt is not willing to speak with CSW at this time.)

## 2020-10-25 NOTE — Consult Note (Signed)
WOC Nurse Consult Note: Reason for Consult:Abrasions beneath leg immobilizer. Wound type:trauma Pressure Injury POA: N/A Measurement:Scattered full and partial thickness lesions consistent with traumatic friction injury  Wound bed:red, moist Drainage (amount, consistency, odor) scant to small serosanguinous Periwound:erythematous Dressing procedure/placement/frequency:  Discussed with Jettie Pagan, CCS PA-C and she viewed the lesions this morning. White petrolatum gauze is in place and she supports continuing that POC. She asked that it be done daily and that I add that guidance for nursing. Same added.  Wounds are to be cleansed daily with NS, patted dry and covered with folded pieces of white petrolatum impregnated gauze (Vaseline). This is to be topped with dry gauze or ABD pad and secured with wither Kerlix roll gauze or paper tape. Change daily.  WOC nursing team will not follow, but will remain available to this patient, the nursing and medical teams.  Please re-consult if needed. Thanks, Ladona Mow, MSN, RN, GNP, [facility]  Pager# [phone]
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
## 2020-10-25 NOTE — Progress Notes (Signed)
Awaiting medical clearance for general anesthesia.  Spoke with trauma PA.  Medicine not clearing for general anesthesia until tomorrow pending results and clinical course.  Ortho repair also delayed until at least tomorrow.  There is a concern about a developing pneumonia in the face of COVID.  I will sign out patient to ENT service tomorrow.  Will need to have mandible repaired once cleared.  Continue Unasyn due to delay in mandible fracture repair. Full liquid diet ONLY.
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
## 2020-10-26 ENCOUNTER — Inpatient Hospital Stay (HOSPITAL_COMMUNITY): Payer: No Typology Code available for payment source | Admitting: Certified Registered"

## 2020-10-26 ENCOUNTER — Encounter (HOSPITAL_COMMUNITY): Admission: EM | Disposition: A | Discharge status: Home / Self Care | Payer: Self-pay | Source: Home / Self Care

## 2020-10-26 ENCOUNTER — Encounter (HOSPITAL_COMMUNITY): Payer: Self-pay

## 2020-10-26 ENCOUNTER — Inpatient Hospital Stay (HOSPITAL_COMMUNITY): Payer: No Typology Code available for payment source

## 2020-10-26 HISTORY — PX: OPEN REDUCTION INTERNAL FIXATION ACETABULUM FRACTURE POSTERIOR: SHX6833

## 2020-10-26 LAB — BASIC METABOLIC PANEL
Anion gap: 11 (ref 5–15)
BUN: 6 mg/dL (ref 6–20)
CO2: 26 mmol/L (ref 22–32)
Calcium: 9.1 mg/dL (ref 8.9–10.3)
Chloride: 101 mmol/L (ref 98–111)
Creatinine, Ser: 1.01 mg/dL (ref 0.61–1.24)
GFR, Estimated: >60 mL/min (ref >60)
Glucose, Bld: 89 mg/dL (ref 70–99)
Potassium: 4.2 mmol/L (ref 3.5–5.1)
Sodium: 138 mmol/L (ref 135–145)

## 2020-10-26 LAB — TYPE AND SCREEN
ABO/RH(D): B POS
Antibody Screen: NEGATIVE

## 2020-10-26 LAB — CBC
HCT: 33.7 % — ABNORMAL LOW (ref 39.0–52.0)
Hemoglobin: 11.7 g/dL — ABNORMAL LOW (ref 13.0–17.0)
MCH: 33.8 pg (ref 26.0–34.0)
MCHC: 34.7 g/dL (ref 30.0–36.0)
MCV: 97.4 fL (ref 80.0–100.0)
Platelets: 275 10*3/uL (ref 150–400)
RBC: 3.46 MIL/uL — ABNORMAL LOW (ref 4.22–5.81)
RDW: 12.5 % (ref 11.5–15.5)
WBC: 8.5 10*3/uL (ref 4.0–10.5)
nRBC: 0 % (ref 0.0–0.2)

## 2020-10-26 LAB — C-REACTIVE PROTEIN: CRP: 7.3 mg/dL — ABNORMAL HIGH (ref <1.0)

## 2020-10-26 LAB — VITAMIN D 25 HYDROXY (VIT D DEFICIENCY, FRACTURES): Vit D, 25-Hydroxy: 23.12 ng/mL — ABNORMAL LOW (ref 30–100)

## 2020-10-26 LAB — PROCALCITONIN: Procalcitonin: <0.1 ng/mL

## 2020-10-26 LAB — D-DIMER, QUANTITATIVE: D-Dimer, Quant: 5.38 ug/mL-FEU — ABNORMAL HIGH (ref 0.00–0.50)

## 2020-10-26 SURGERY — OPEN REDUCTION INTERNAL FIXATION ACETABULUM FRACTURE POSTERIOR
Anesthesia: General | Laterality: Left

## 2020-10-26 MED ORDER — SUCCINYLCHOLINE CHLORIDE 200 MG/10ML IV SOSY
PREFILLED_SYRINGE | INTRAVENOUS | Status: DC | PRN
  Administered 2020-10-26: 200 mg via INTRAVENOUS

## 2020-10-26 MED ORDER — 0.9 % SODIUM CHLORIDE (POUR BTL) OPTIME
TOPICAL | Status: DC | PRN
  Administered 2020-10-26: 1000 mL

## 2020-10-26 MED ORDER — PROPOFOL 10 MG/ML IV BOLUS
INTRAVENOUS | Status: AC
  Filled 2020-10-26: qty 20

## 2020-10-26 MED ORDER — HYDROMORPHONE HCL 1 MG/ML IJ SOLN
INTRAMUSCULAR | Status: AC
  Filled 2020-10-26: qty 0.5

## 2020-10-26 MED ORDER — ROCURONIUM BROMIDE 10 MG/ML (PF) SYRINGE
PREFILLED_SYRINGE | INTRAVENOUS | Status: AC
  Filled 2020-10-26: qty 10

## 2020-10-26 MED ORDER — DEXMEDETOMIDINE (PRECEDEX) IN NS 20 MCG/5ML (4 MCG/ML) IV SYRINGE
PREFILLED_SYRINGE | INTRAVENOUS | Status: DC | PRN
  Administered 2020-10-26: 4 ug via INTRAVENOUS
  Administered 2020-10-26 (×2): 8 ug via INTRAVENOUS

## 2020-10-26 MED ORDER — PROPOFOL 10 MG/ML IV BOLUS
INTRAVENOUS | Status: DC | PRN
  Administered 2020-10-26: 150 mg via INTRAVENOUS

## 2020-10-26 MED ORDER — KETAMINE HCL 10 MG/ML IJ SOLN
INTRAMUSCULAR | Status: DC | PRN
  Administered 2020-10-26: 20 mg via INTRAVENOUS
  Administered 2020-10-26 (×3): 10 mg via INTRAVENOUS

## 2020-10-26 MED ORDER — ONDANSETRON HCL 4 MG/2ML IJ SOLN
INTRAMUSCULAR | Status: AC
  Filled 2020-10-26: qty 2

## 2020-10-26 MED ORDER — PHENYLEPHRINE 40 MCG/ML (10ML) SYRINGE FOR IV PUSH (FOR BLOOD PRESSURE SUPPORT)
PREFILLED_SYRINGE | INTRAVENOUS | Status: DC | PRN
  Administered 2020-10-26 (×2): 80 ug via INTRAVENOUS

## 2020-10-26 MED ORDER — FENTANYL CITRATE (PF) 100 MCG/2ML IJ SOLN
INTRAMUSCULAR | Status: DC | PRN
  Administered 2020-10-26: 100 ug via INTRAVENOUS
  Administered 2020-10-26 (×3): 50 ug via INTRAVENOUS

## 2020-10-26 MED ORDER — LACTATED RINGERS IV SOLN: INTRAVENOUS | Status: DC | PRN

## 2020-10-26 MED ORDER — HYDROMORPHONE HCL 1 MG/ML IJ SOLN
INTRAMUSCULAR | Status: DC | PRN
  Administered 2020-10-26 (×2): .25 mg via INTRAVENOUS

## 2020-10-26 MED ORDER — DEXAMETHASONE SODIUM PHOSPHATE 10 MG/ML IJ SOLN
INTRAMUSCULAR | Status: DC | PRN
Start: 2020-10-26 — End: 2020-10-26
  Administered 2020-10-26: 10 mg via INTRAVENOUS

## 2020-10-26 MED ORDER — FENTANYL CITRATE (PF) 250 MCG/5ML IJ SOLN
INTRAMUSCULAR | Status: AC
  Filled 2020-10-26: qty 5

## 2020-10-26 MED ORDER — ONDANSETRON HCL 4 MG/2ML IJ SOLN
INTRAMUSCULAR | Status: DC | PRN
  Administered 2020-10-26: 4 mg via INTRAVENOUS

## 2020-10-26 MED ORDER — ENOXAPARIN SODIUM 30 MG/0.3ML IJ SOSY
30.0000 mg | PREFILLED_SYRINGE | Freq: Two times a day (BID) | INTRAMUSCULAR | Status: DC
  Administered 2020-10-28 – 2020-10-30 (×5): 30 mg via SUBCUTANEOUS
  Filled 2020-10-26 (×5): qty 0.3

## 2020-10-26 MED ORDER — ROCURONIUM BROMIDE 10 MG/ML (PF) SYRINGE
PREFILLED_SYRINGE | INTRAVENOUS | Status: DC | PRN
  Administered 2020-10-26: 20 mg via INTRAVENOUS
  Administered 2020-10-26: 30 mg via INTRAVENOUS
  Administered 2020-10-26: 40 mg via INTRAVENOUS
  Administered 2020-10-26: 30 mg via INTRAVENOUS
  Administered 2020-10-26: 10 mg via INTRAVENOUS
  Administered 2020-10-26: 30 mg via INTRAVENOUS

## 2020-10-26 MED ORDER — MIDAZOLAM HCL 2 MG/2ML IJ SOLN
INTRAMUSCULAR | Status: DC | PRN
  Administered 2020-10-26: 2 mg via INTRAVENOUS

## 2020-10-26 MED ORDER — LIDOCAINE 2% (20 MG/ML) 5 ML SYRINGE
INTRAMUSCULAR | Status: DC | PRN
  Administered 2020-10-26: 60 mg via INTRAVENOUS

## 2020-10-26 MED ORDER — ALBUMIN HUMAN 5 % IV SOLN: INTRAVENOUS | Status: DC | PRN

## 2020-10-26 MED ORDER — MIDAZOLAM HCL 2 MG/2ML IJ SOLN
INTRAMUSCULAR | Status: AC
  Filled 2020-10-26: qty 2

## 2020-10-26 MED ORDER — HYDROMORPHONE HCL 1 MG/ML IJ SOLN
0.2500 mg | INTRAMUSCULAR | Status: DC | PRN
  Administered 2020-10-26 (×2): 0.5 mg via INTRAVENOUS

## 2020-10-26 MED ORDER — SUGAMMADEX SODIUM 200 MG/2ML IV SOLN
INTRAVENOUS | Status: DC | PRN
  Administered 2020-10-26: 200 mg via INTRAVENOUS

## 2020-10-26 MED ORDER — PHENYLEPHRINE 40 MCG/ML (10ML) SYRINGE FOR IV PUSH (FOR BLOOD PRESSURE SUPPORT)
PREFILLED_SYRINGE | INTRAVENOUS | Status: AC
  Filled 2020-10-26: qty 10

## 2020-10-26 MED ORDER — KETAMINE HCL 50 MG/5ML IJ SOSY
PREFILLED_SYRINGE | INTRAMUSCULAR | Status: AC
  Filled 2020-10-26: qty 5

## 2020-10-26 MED ORDER — CHLORHEXIDINE GLUCONATE CLOTH 2 % EX PADS
6.0000 | MEDICATED_PAD | Freq: Once | CUTANEOUS | Status: AC
  Administered 2020-10-26: 6 via TOPICAL

## 2020-10-26 MED ORDER — HYDROMORPHONE HCL 1 MG/ML IJ SOLN
INTRAMUSCULAR | Status: AC
  Administered 2020-10-27: 0.5 mg via INTRAVENOUS
  Filled 2020-10-26: qty 1

## 2020-10-26 MED ORDER — SUCCINYLCHOLINE CHLORIDE 200 MG/10ML IV SOSY
PREFILLED_SYRINGE | INTRAVENOUS | Status: AC
  Filled 2020-10-26: qty 10

## 2020-10-26 MED ORDER — LIDOCAINE 2% (20 MG/ML) 5 ML SYRINGE
INTRAMUSCULAR | Status: AC
  Filled 2020-10-26: qty 5

## 2020-10-26 MED ORDER — DEXAMETHASONE SODIUM PHOSPHATE 10 MG/ML IJ SOLN
INTRAMUSCULAR | Status: AC
  Filled 2020-10-26: qty 1

## 2020-10-26 SURGICAL SUPPLY — 69 items
BAG COUNTER SPONGE SURGICOUNT (BAG) ×2 IMPLANT
BAG SPNG CNTER NS LX DISP (BAG) ×1
BIT DRILL AO MATTA 2.5MX230M (BIT) IMPLANT
BIT DRILL STEP 3.5 (DRILL) IMPLANT
BLADE CLIPPER SURG (BLADE) IMPLANT
BRUSH SCRUB EZ PLAIN DRY (MISCELLANEOUS) ×4 IMPLANT
COVER SURGICAL LIGHT HANDLE (MISCELLANEOUS) ×2 IMPLANT
DRAPE C-ARM 42X72 X-RAY (DRAPES) ×2 IMPLANT
DRAPE C-ARMOR (DRAPES) ×2 IMPLANT
DRAPE INCISE IOBAN 66X45 STRL (DRAPES) ×2 IMPLANT
DRAPE INCISE IOBAN 85X60 (DRAPES) ×2 IMPLANT
DRAPE ORTHO SPLIT 77X108 STRL (DRAPES) ×4
DRAPE SURG ORHT 6 SPLT 77X108 (DRAPES) ×2 IMPLANT
DRAPE U-SHAPE 47X51 STRL (DRAPES) ×2 IMPLANT
DRILL BIT AO MATTA 2.5MX230M (BIT) ×2
DRILL STEP 3.5 (DRILL)
DRSG MEPILEX BORDER 4X12 (GAUZE/BANDAGES/DRESSINGS) ×1 IMPLANT
DRSG MEPILEX BORDER 4X8 (GAUZE/BANDAGES/DRESSINGS) IMPLANT
ELECT BLADE 6.5 EXT (BLADE) ×2 IMPLANT
ELECT REM PT RETURN 9FT ADLT (ELECTROSURGICAL) ×2
ELECTRODE REM PT RTRN 9FT ADLT (ELECTROSURGICAL) ×1 IMPLANT
GLOVE SRG 8 PF TXTR STRL LF DI (GLOVE) ×1 IMPLANT
GLOVE SURG ENC MOIS LTX SZ7.5 (GLOVE) ×2 IMPLANT
GLOVE SURG ENC MOIS LTX SZ8 (GLOVE) ×2 IMPLANT
GLOVE SURG UNDER POLY LF SZ7.5 (GLOVE) ×2 IMPLANT
GLOVE SURG UNDER POLY LF SZ8 (GLOVE) ×2
GOWN STRL REUS W/ TWL LRG LVL3 (GOWN DISPOSABLE) ×2 IMPLANT
GOWN STRL REUS W/ TWL XL LVL3 (GOWN DISPOSABLE) ×2 IMPLANT
GOWN STRL REUS W/TWL LRG LVL3 (GOWN DISPOSABLE) ×4
GOWN STRL REUS W/TWL XL LVL3 (GOWN DISPOSABLE) ×4
HANDPIECE INTERPULSE COAX TIP (DISPOSABLE) ×2
KIT BASIN OR (CUSTOM PROCEDURE TRAY) ×2 IMPLANT
KIT TURNOVER KIT B (KITS) ×2 IMPLANT
MANIFOLD NEPTUNE II (INSTRUMENTS) ×2 IMPLANT
NS IRRIG 1000ML POUR BTL (IV SOLUTION) ×2 IMPLANT
PACK TOTAL JOINT (CUSTOM PROCEDURE TRAY) ×2 IMPLANT
PAD ARMBOARD 7.5X6 YLW CONV (MISCELLANEOUS) ×4 IMPLANT
PIN APEX 180X50X5XST SLF (PIN) IMPLANT
PIN APEX 5X180 (PIN) ×2
PLATE ACET STRT 94.5M 8H (Plate) ×1 IMPLANT
PLATE SPRING 2H 3.5MM PELVIC (Plate) ×1 IMPLANT
PLATE SPRING 3.5MM 3H STRL (Plate) ×1 IMPLANT
RETRIEVER SUT HEWSON (MISCELLANEOUS) ×2 IMPLANT
SCREW CORTEX ST MATTA 3.5X18MM (Screw) ×1 IMPLANT
SCREW CORTEX ST MATTA 3.5X28MM (Screw) ×1 IMPLANT
SCREW CORTEX ST MATTA 3.5X32MM (Screw) ×1 IMPLANT
SCREW CORTEX ST MATTA 3.5X34MM (Screw) ×2 IMPLANT
SCREW CORTEX ST MATTA 3.5X38M (Screw) ×1 IMPLANT
SET HNDPC FAN SPRY TIP SCT (DISPOSABLE) ×1 IMPLANT
SPONGE T-LAP 18X18 ~~LOC~~+RFID (SPONGE) IMPLANT
STAPLER VISISTAT 35W (STAPLE) ×2 IMPLANT
STRIP CLOSURE SKIN 1/2X4 (GAUZE/BANDAGES/DRESSINGS) ×2 IMPLANT
SUCTION FRAZIER HANDLE 10FR (MISCELLANEOUS) ×2
SUCTION TUBE FRAZIER 10FR DISP (MISCELLANEOUS) ×1 IMPLANT
SUT ETHILON 2 0 PSLX (SUTURE) ×4 IMPLANT
SUT FIBERWIRE #2 38 T-5 BLUE (SUTURE) ×4
SUT VIC AB 0 CT1 27 (SUTURE) ×2
SUT VIC AB 0 CT1 27XBRD ANBCTR (SUTURE) ×1 IMPLANT
SUT VIC AB 1 CT1 18XCR BRD 8 (SUTURE) ×1 IMPLANT
SUT VIC AB 1 CT1 27 (SUTURE) ×2
SUT VIC AB 1 CT1 27XBRD ANBCTR (SUTURE) ×1 IMPLANT
SUT VIC AB 1 CT1 8-18 (SUTURE) ×2
SUT VIC AB 2-0 CT1 27 (SUTURE) ×2
SUT VIC AB 2-0 CT1 TAPERPNT 27 (SUTURE) ×1 IMPLANT
SUTURE FIBERWR #2 38 T-5 BLUE (SUTURE) ×2 IMPLANT
TOWEL GREEN STERILE (TOWEL DISPOSABLE) ×4 IMPLANT
TOWEL GREEN STERILE FF (TOWEL DISPOSABLE) ×4 IMPLANT
TRAY FOLEY MTR SLVR 16FR STAT (SET/KITS/TRAYS/PACK) IMPLANT
WATER STERILE IRR 1000ML POUR (IV SOLUTION) IMPLANT
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
## 2020-10-26 NOTE — Progress Notes (Signed)
Spoke with the patient's nurse Mika to let her know he is scheduled for a radiation treatment to his hip on 10/27/2020 at the cancer center at 10:15 am.  I asked that she call and get [facility] set up to have the patient here about 10:00 am.  Coralyn Helling. Vickii Chafe, BSN

## 2020-10-26 NOTE — Anesthesia Procedure Notes (Signed)
Procedure Name: Intubation Date/Time: 10/26/2020 2:15 PM Performed by: De Nurse, CRNA Pre-anesthesia Checklist: Patient identified, Emergency Drugs available, Suction available and Patient being monitored Patient Re-evaluated:Patient Re-evaluated prior to induction Oxygen Delivery Method: Circle System Utilized Preoxygenation: Pre-oxygenation with 100% oxygen Induction Type: IV induction and Rapid sequence Laryngoscope Size: Glidescope and 4 Grade View: Grade I Tube type: Oral Tube size: 7.5 mm Number of attempts: 1 Airway Equipment and Method: Stylet and Oral airway Placement Confirmation: ETT inserted through vocal cords under direct vision, positive ETCO2 and breath sounds checked- equal and bilateral Secured at: 22 cm Tube secured with: Tape Dental Injury: Teeth and Oropharynx as per pre-operative assessment  Difficulty Due To: Difficulty was anticipated and Difficult Airway- due to dentition Comments: Elective Glidescope d/t maxilla/Mandible fractures and presence of loose teeth
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
## 2020-10-26 NOTE — Progress Notes (Signed)
[facility] PROGRESS NOTE  Patient: ADIL TUGWELL CXK:[id number]   PCP: No primary care provider on file. DOB: 10/28/1990   DOA: 10/22/2020   DOS: 10/26/2020    Assessment and plan: 1.  Aspiration pneumonia Most likely, patient is suffering from aspiration pneumonia based on elevated procalcitonin, improvement in fever curve, leucocytosis and CRP after initiation of IV Unasyn.  Continue Antibiotics for 7 days.  Follow-up on cultures.  So far negative for 3 days.   2.  COVID-19 infection Cycle threshold value 40. CT scan not consistent with typical COVID-19 pneumonia. Procalcitonin minimally elevated as well as leukocytosis not consistent with COVID-19 infections. At present patient does not appear to be suffering from active COVID-19 infection. Given that the infection was likely occurred 3 weeks ago patient will not even require isolation per protocol. Would not be a good candidate for remdesivir or any other antiviral therapy given duration of the illness. If condition worsens may require steroids.  We will signoff, please call us back if any questions.   Subjective: no new complains, no acute events overnight.   Objective:  Vitals:   10/26/20 0812 10/26/20 1246  BP: (!) 151/92 (!) 141/76  Pulse: 91 85  Resp: 19 20  Temp: 98.3 F (36.8 C) 99 F (37.2 C)  SpO2: 96% 98%    General: Appear in mild distress, Cardiovascular: S1 and S2 Present, no Murmur, Respiratory: good respiratory effort, Bilateral Air entry present and faint basal Crackles, no wheezes Abdomen: Bowel Sound present,  Extremities: no Pedal edema  Author: Lynden Oxford, MD [facility] 10/26/2020 1:38 PM   If 7PM-7AM, please contact night-coverage at [email or web address]
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
## 2020-10-26 NOTE — Progress Notes (Signed)
Subjective: Pt resting comfortably in bed. No new complaint.  Objective: Vital signs in last 24 hours: Temp:  [98.3 F (36.8 C)-99.9 F (37.7 C)] 98.3 F (36.8 C) (09/08 0812) Pulse Rate:  [91-108] 91 (09/08 0812) Resp:  [14-20] 19 (09/08 0812) BP: (148-174)/(79-102) 151/92 (09/08 0812) SpO2:  [93 %-100 %] 96 % (09/08 1537)  Physical Exam: General : Patient is awake, alert, and competent Eyes: Pupils equal round and reactive to light/extraocular movements intact/visual acuity grossly normal Ears: Pinna normal without mastoid tenderness or bruising/no blood in ear canals Oral: Patient has poor dentition with multiple fractured teeth. Patient has pain with opening and closing mouth but appears to have reasonably good occlusion Face: Patient has tenderness overlying the left zygoma with associated soft tissue swelling as well as tenderness overlying both temporomandibular joints. Neck: Neck without crepitance or tenderness/trachea midline Neuro: Cranial nerves II through XII intact with numbness involving left buccal mucosa and mental skin   Recent Labs    10/25/20 0154 10/26/20 0344  WBC 12.2* 8.5  HGB 10.9* 11.7*  HCT 31.2* 33.7*  PLT 226 275   Recent Labs    10/25/20 0154 10/26/20 0344  NA 135 138  K 4.2 4.2  CL 104 101  CO2 24 26  GLUCOSE 95 89  BUN 6 6  CREATININE 0.97 1.01  CALCIUM 8.3* 9.1    Medications: I have reviewed the patient's current medications. Scheduled:  acetaminophen  1,000 mg Oral Q6H   docusate sodium  100 mg Oral BID   folic acid  1 mg Oral Daily   mouth rinse  15 mL Mouth Rinse BID   multivitamin with minerals  1 tablet Oral Daily   mupirocin ointment  1 application Nasal BID   thiamine  100 mg Oral Daily   Or   thiamine  100 mg Intravenous Daily   Continuous:  ampicillin-sulbactam (UNASYN) IV 1.5 g (10/26/20 0552)   lactated ringers 50 mL/hr at 10/25/20 0955   HKF:EXMDYJWLKHV, HYDROmorphone (DILAUDID) injection, hydrOXYzine,  loperamide, LORazepam **OR** LORazepam, methocarbamol, ondansetron **OR** ondansetron (ZOFRAN) IV, oxyCODONE  Assessment/Plan: - Mandibular fractures (left parasymphyseal, right condylar), plan ORIF and MMF in OR tomorrow at noon. - The rest of the facial fractures can be observed conservatively. - The R/B/A/D of the procedures are discussed with the patient and his mother. - NPO after midnight.   LOS: 4 days   Andrew Simon W Monque Haggar 10/26/2020, 12:15 PM

## 2020-10-26 NOTE — Progress Notes (Signed)
PT Cancellation Note  Patient Details Name: Andrew Simon MRN: [id number] DOB: July 31, 1990   Cancelled Treatment:    Reason Eval/Treat Not Completed: Active bedrest order Per [name] PA note, plan for OR this date (9/8) for L hip fixation. Active bedrest order currently with plans for TWB on L following surgery. PT will follow up following surgery.   [name] A. [name] PT, DPT Acute Rehabilitation Services Pager [phone] Office [phone]    [name] 10/26/2020, 7:57 AM

## 2020-10-26 NOTE — Anesthesia Postprocedure Evaluation (Signed)
Anesthesia Post Note  Patient: Andrew Simon  Procedure(s) Performed: OPEN REDUCTION INTERNAL FIXATION ACETABULUM FRACTURE POSTERIOR. TRANSVERE POSTERIOR WALL (Left)     Patient location during evaluation: PACU Anesthesia Type: General Level of consciousness: awake and alert Pain management: pain level controlled Vital Signs Assessment: post-procedure vital signs reviewed and stable Respiratory status: spontaneous breathing, nonlabored ventilation, respiratory function stable and patient connected to nasal cannula oxygen Cardiovascular status: blood pressure returned to baseline and stable Postop Assessment: no apparent nausea or vomiting Anesthetic complications: no   No notable events documented.  Last Vitals:  Vitals:   10/26/20 1948 10/26/20 2100  BP: (!) 176/80   Pulse:  (!) 106  Resp: 11 17  Temp: 37.4 C   SpO2:  100%    Last Pain:  Vitals:   10/26/20 1948  TempSrc: Oral  PainSc:                  [name]

## 2020-10-26 NOTE — Transfer of Care (Signed)
Immediate Anesthesia Transfer of Care Note  Patient: Andrew Simon  Procedure(s) Performed: OPEN REDUCTION INTERNAL FIXATION ACETABULUM FRACTURE POSTERIOR. TRANSVERE POSTERIOR WALL (Left)  Patient Location: PACU  Anesthesia Type:General  Level of Consciousness: awake, alert  and oriented  Airway & Oxygen Therapy: Patient Spontanous Breathing  Post-op Assessment: Report given to RN  Post vital signs: Reviewed and stable  Last Vitals:  Vitals Value Taken Time  BP    Temp    Pulse    Resp    SpO2 97     Last Pain:  Vitals:   10/26/20 1246  TempSrc: Oral  PainSc:       Patients Stated Pain Goal: 2 (10/25/20 0629)  Complications: No notable events documented.
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
## 2020-10-27 ENCOUNTER — Encounter: Payer: Self-pay | Admitting: Radiation Oncology

## 2020-10-27 ENCOUNTER — Inpatient Hospital Stay (HOSPITAL_COMMUNITY): Payer: No Typology Code available for payment source | Admitting: Anesthesiology

## 2020-10-27 ENCOUNTER — Inpatient Hospital Stay (HOSPITAL_COMMUNITY): Payer: No Typology Code available for payment source

## 2020-10-27 ENCOUNTER — Ambulatory Visit
Admit: 2020-10-27 | Discharge: 2020-10-27 | Disposition: A | Discharge status: Home / Self Care | Payer: No Typology Code available for payment source | Attending: Radiation Oncology | Admitting: Radiation Oncology

## 2020-10-27 ENCOUNTER — Encounter (HOSPITAL_COMMUNITY): Admission: EM | Disposition: A | Discharge status: Home / Self Care | Payer: Self-pay | Source: Home / Self Care

## 2020-10-27 DIAGNOSIS — M898X9 Other specified disorders of bone, unspecified site: Secondary | ICD-10-CM

## 2020-10-27 HISTORY — PX: ORIF MANDIBULAR FRACTURE: SHX2127

## 2020-10-27 LAB — D-DIMER, QUANTITATIVE: D-Dimer, Quant: 7.31 ug/mL-FEU — ABNORMAL HIGH (ref 0.00–0.50)

## 2020-10-27 LAB — ABO/RH: ABO/RH(D): B POS

## 2020-10-27 LAB — C-REACTIVE PROTEIN: CRP: 6.6 mg/dL — ABNORMAL HIGH (ref <1.0)

## 2020-10-27 SURGERY — OPEN REDUCTION INTERNAL FIXATION (ORIF) MANDIBULAR FRACTURE
Anesthesia: General

## 2020-10-27 MED ORDER — CHLORHEXIDINE GLUCONATE 0.12 % MT SOLN
OROMUCOSAL | Status: AC
  Filled 2020-10-27: qty 15

## 2020-10-27 MED ORDER — HYDROMORPHONE HCL 1 MG/ML IJ SOLN
INTRAMUSCULAR | Status: AC
  Filled 2020-10-27: qty 1

## 2020-10-27 MED ORDER — FENTANYL CITRATE (PF) 100 MCG/2ML IJ SOLN
INTRAMUSCULAR | Status: DC | PRN
  Administered 2020-10-27: 100 ug via INTRAVENOUS
  Administered 2020-10-27: 50 ug via INTRAVENOUS
  Administered 2020-10-27: 100 ug via INTRAVENOUS

## 2020-10-27 MED ORDER — OXYCODONE HCL 5 MG PO TABS: 5.0000 mg | ORAL_TABLET | Freq: Once | ORAL | Status: DC | PRN

## 2020-10-27 MED ORDER — OXYMETAZOLINE HCL 0.05 % NA SOLN
NASAL | Status: AC
  Filled 2020-10-27: qty 30

## 2020-10-27 MED ORDER — ONDANSETRON HCL 4 MG/2ML IJ SOLN
INTRAMUSCULAR | Status: DC | PRN
  Administered 2020-10-27: 4 mg via INTRAVENOUS

## 2020-10-27 MED ORDER — DEXMEDETOMIDINE (PRECEDEX) IN NS 20 MCG/5ML (4 MCG/ML) IV SYRINGE
PREFILLED_SYRINGE | INTRAVENOUS | Status: AC
  Filled 2020-10-27: qty 5

## 2020-10-27 MED ORDER — METHOCARBAMOL 750 MG PO TABS
750.0000 mg | ORAL_TABLET | Freq: Three times a day (TID) | ORAL | Status: DC
  Administered 2020-10-27 – 2020-10-30 (×8): 750 mg via ORAL
  Filled 2020-10-27 (×9): qty 1

## 2020-10-27 MED ORDER — MIDAZOLAM HCL 2 MG/2ML IJ SOLN
INTRAMUSCULAR | Status: AC
  Filled 2020-10-27: qty 2

## 2020-10-27 MED ORDER — ARTIFICIAL TEARS OPHTHALMIC OINT
TOPICAL_OINTMENT | OPHTHALMIC | Status: AC
  Filled 2020-10-27: qty 3.5

## 2020-10-27 MED ORDER — OXYCODONE HCL 5 MG/5ML PO SOLN: 5.0000 mg | Freq: Once | ORAL | Status: DC | PRN

## 2020-10-27 MED ORDER — DEXMEDETOMIDINE (PRECEDEX) IN NS 20 MCG/5ML (4 MCG/ML) IV SYRINGE
PREFILLED_SYRINGE | INTRAVENOUS | Status: DC | PRN
  Administered 2020-10-27: 12 ug via INTRAVENOUS
  Administered 2020-10-27: 8 ug via INTRAVENOUS

## 2020-10-27 MED ORDER — PROMETHAZINE HCL 25 MG/ML IJ SOLN: 6.2500 mg | INTRAMUSCULAR | Status: DC | PRN

## 2020-10-27 MED ORDER — HYDROMORPHONE HCL 1 MG/ML IJ SOLN
INTRAMUSCULAR | Status: DC | PRN
  Administered 2020-10-27: 1 mg via INTRAVENOUS

## 2020-10-27 MED ORDER — DEXAMETHASONE SODIUM PHOSPHATE 10 MG/ML IJ SOLN
INTRAMUSCULAR | Status: DC | PRN
  Administered 2020-10-27: 10 mg via INTRAVENOUS

## 2020-10-27 MED ORDER — ONDANSETRON HCL 4 MG/2ML IJ SOLN
INTRAMUSCULAR | Status: AC
  Filled 2020-10-27: qty 2

## 2020-10-27 MED ORDER — OXYCODONE HCL 5 MG/5ML PO SOLN
5.0000 mg | ORAL | Status: DC | PRN
  Administered 2020-10-27 – 2020-10-30 (×8): 15 mg via ORAL
  Administered 2020-10-30: 5 mg via ORAL
  Filled 2020-10-27 (×6): qty 15
  Filled 2020-10-27: qty 5
  Filled 2020-10-27 (×3): qty 15

## 2020-10-27 MED ORDER — MIDAZOLAM HCL 5 MG/5ML IJ SOLN
INTRAMUSCULAR | Status: DC | PRN
  Administered 2020-10-27: 2 mg via INTRAVENOUS

## 2020-10-27 MED ORDER — CHLORHEXIDINE GLUCONATE 0.12 % MT SOLN
OROMUCOSAL | Status: AC
  Filled 2020-10-27: qty 45

## 2020-10-27 MED ORDER — LIDOCAINE-EPINEPHRINE 1 %-1:100000 IJ SOLN
INTRAMUSCULAR | Status: DC | PRN
  Administered 2020-10-27: 1 mL

## 2020-10-27 MED ORDER — LIDOCAINE 2% (20 MG/ML) 5 ML SYRINGE
INTRAMUSCULAR | Status: AC
  Filled 2020-10-27: qty 5

## 2020-10-27 MED ORDER — SUCCINYLCHOLINE CHLORIDE 200 MG/10ML IV SOSY
PREFILLED_SYRINGE | INTRAVENOUS | Status: AC
  Filled 2020-10-27: qty 10

## 2020-10-27 MED ORDER — HYDROMORPHONE HCL 1 MG/ML IJ SOLN: 1.0000 mg | Freq: Once | INTRAMUSCULAR | Status: AC

## 2020-10-27 MED ORDER — LIDOCAINE HCL (CARDIAC) PF 100 MG/5ML IV SOSY
PREFILLED_SYRINGE | INTRAVENOUS | Status: DC | PRN
  Administered 2020-10-27: 60 mg via INTRAVENOUS

## 2020-10-27 MED ORDER — HYDROMORPHONE HCL 1 MG/ML IJ SOLN
INTRAMUSCULAR | Status: AC
  Administered 2020-10-27: 1 mg via INTRAVENOUS
  Filled 2020-10-27: qty 1

## 2020-10-27 MED ORDER — ACETAMINOPHEN 160 MG/5ML PO SOLN
1000.0000 mg | Freq: Four times a day (QID) | ORAL | Status: DC
  Administered 2020-10-27 – 2020-10-30 (×12): 1000 mg via ORAL
  Filled 2020-10-27 (×12): qty 40.6

## 2020-10-27 MED ORDER — SUCCINYLCHOLINE CHLORIDE 200 MG/10ML IV SOSY
PREFILLED_SYRINGE | INTRAVENOUS | Status: DC | PRN
  Administered 2020-10-27: 120 mg via INTRAVENOUS

## 2020-10-27 MED ORDER — DOCUSATE SODIUM 50 MG/5ML PO LIQD
100.0000 mg | Freq: Two times a day (BID) | ORAL | Status: DC
  Administered 2020-10-27 – 2020-10-30 (×6): 100 mg via ORAL
  Filled 2020-10-27 (×6): qty 10

## 2020-10-27 MED ORDER — AMPICILLIN-SULBACTAM SODIUM 1.5 (1-0.5) G IJ SOLR
1.5000 g | Freq: Three times a day (TID) | INTRAMUSCULAR | Status: DC
  Administered 2020-10-27 – 2020-10-30 (×9): 1.5 g via INTRAVENOUS
  Filled 2020-10-27 (×12): qty 4

## 2020-10-27 MED ORDER — PROPOFOL 10 MG/ML IV BOLUS
INTRAVENOUS | Status: DC | PRN
  Administered 2020-10-27: 200 mg via INTRAVENOUS

## 2020-10-27 MED ORDER — DEXAMETHASONE SODIUM PHOSPHATE 10 MG/ML IJ SOLN
INTRAMUSCULAR | Status: AC
  Filled 2020-10-27: qty 1

## 2020-10-27 MED ORDER — BACITRACIN ZINC 500 UNIT/GM EX OINT
TOPICAL_OINTMENT | CUTANEOUS | Status: AC
  Filled 2020-10-27: qty 28.35

## 2020-10-27 MED ORDER — LIDOCAINE-EPINEPHRINE (PF) 1 %-1:200000 IJ SOLN
INTRAMUSCULAR | Status: AC
  Filled 2020-10-27: qty 30

## 2020-10-27 MED ORDER — CHOLECALCIFEROL 10 MCG/ML (400 UNIT/ML) PO LIQD
5000.0000 [IU] | Freq: Every day | ORAL | Status: DC
  Administered 2020-10-27 – 2020-10-30 (×4): 5000 [IU] via ORAL
  Filled 2020-10-27 (×4): qty 12.5

## 2020-10-27 MED ORDER — HYDROMORPHONE HCL 1 MG/ML IJ SOLN: 0.2500 mg | INTRAMUSCULAR | Status: DC | PRN

## 2020-10-27 SURGICAL SUPPLY — 30 items
BAG COUNTER SPONGE SURGICOUNT (BAG) ×2 IMPLANT
BAG DECANTER FOR FLEXI CONT (MISCELLANEOUS) IMPLANT
BAG SPNG CNTER NS LX DISP (BAG) ×1
BLADE SURG 15 STRL LF DISP TIS (BLADE) IMPLANT
BLADE SURG 15 STRL SS (BLADE)
CANISTER SUCT 3000ML PPV (MISCELLANEOUS) ×2 IMPLANT
CLEANER TIP ELECTROSURG 2X2 (MISCELLANEOUS) ×2 IMPLANT
DRAPE HALF SHEET 40X57 (DRAPES) IMPLANT
ELECT COATED BLADE 2.86 ST (ELECTRODE) IMPLANT
ELECT NDL BLADE 2-5/6 (NEEDLE) IMPLANT
ELECT NEEDLE BLADE 2-5/6 (NEEDLE) IMPLANT
ELECT REM PT RETURN 9FT ADLT (ELECTROSURGICAL) ×2
ELECTRODE REM PT RTRN 9FT ADLT (ELECTROSURGICAL) ×1 IMPLANT
GLOVE SURG LTX SZ7.5 (GLOVE) ×2 IMPLANT
GOWN STRL REUS W/ TWL LRG LVL3 (GOWN DISPOSABLE) ×2 IMPLANT
GOWN STRL REUS W/TWL LRG LVL3 (GOWN DISPOSABLE) ×4
KIT BASIN OR (CUSTOM PROCEDURE TRAY) ×2 IMPLANT
KIT TURNOVER KIT B (KITS) ×2 IMPLANT
NDL HYPO 25GX1X1/2 BEV (NEEDLE) IMPLANT
NEEDLE HYPO 25GX1X1/2 BEV (NEEDLE) IMPLANT
NS IRRIG 1000ML POUR BTL (IV SOLUTION) ×2 IMPLANT
PAD ARMBOARD 7.5X6 YLW CONV (MISCELLANEOUS) ×4 IMPLANT
PENCIL SMOKE EVACUATOR (MISCELLANEOUS) ×2 IMPLANT
SCISSORS WIRE ANG 4 3/4 DISP (INSTRUMENTS) IMPLANT
SCREW MNDBLE 2.0X8 BONE (Screw) ×4 IMPLANT
SUT VIC AB 3-0 FS2 27 (SUTURE) IMPLANT
TOOTHBRUSH ADULT (PERSONAL CARE ITEMS) ×2 IMPLANT
TOWEL GREEN STERILE FF (TOWEL DISPOSABLE) ×2 IMPLANT
TRAY ENT MC OR (CUSTOM PROCEDURE TRAY) ×2 IMPLANT
WATER STERILE IRR 1000ML POUR (IV SOLUTION) IMPLANT
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
## 2020-10-27 NOTE — Progress Notes (Signed)
OT Cancellation Note  Patient Details Name: Andrew Simon MRN: [id number] DOB: [birth year]   Cancelled Treatment:    Reason Eval/Treat Not Completed: Patient at procedure or test/ unavailable (XRT; then scheduled for OR) will follow up later time  [facility], OT/L   Acute OT Clinical Specialist Acute Rehabilitation Services Pager [phone] Office [phone]  10/27/2020, 12:30 PM
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
## 2020-10-27 NOTE — Transfer of Care (Signed)
Immediate Anesthesia Transfer of Care Note  Patient: Andrew Simon  Procedure(s) Performed: OPEN REDUCTION INTERNAL FIXATION (ORIF) MANDIBULAR FRACTURE MMF  Patient Location: PACU  Anesthesia Type:General  Level of Consciousness: awake, alert , oriented and patient cooperative  Airway & Oxygen Therapy: Patient Spontanous Breathing and Patient connected to face mask oxygen  Post-op Assessment: Report given to RN, Post -op Vital signs reviewed and stable and Patient moving all extremities X 4  Post vital signs: Reviewed and stable  Last Vitals:  Vitals Value Taken Time  BP 203/115 10/27/20 1529  Temp 37.5 C 10/27/20 1526  Pulse 115 10/27/20 1535  Resp 13 10/27/20 1535  SpO2 99 % 10/27/20 1535  Vitals shown include unvalidated device data.  Last Pain:  Vitals:   10/27/20 1526  TempSrc:   PainSc: 0-No pain      Patients Stated Pain Goal: 3 (10/26/20 1755)  Complications: No notable events documented.

## 2020-10-27 NOTE — Progress Notes (Signed)
PT Cancellation Note  Patient Details Name: Andrew Simon MRN: [id number] DOB: 1990-09-01   Cancelled Treatment:    Reason Eval/Treat Not Completed: Patient at procedure or test/unavailable; still out for XRT at [facility].  Noted for mandibular fixation later today.  Will attempt again another day.   Elray Mcgregor 10/27/2020, 10:40 AM Sheran Lawless, PT Acute Rehabilitation Services Pager:[phone] Office:[phone] 10/27/2020
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
## 2020-10-27 NOTE — Anesthesia Procedure Notes (Signed)
Procedure Name: Intubation Date/Time: 10/27/2020 2:24 PM Performed by: Jonna Munro, CRNA Pre-anesthesia Checklist: Patient identified, Emergency Drugs available, Suction available, Patient being monitored and Timeout performed Patient Re-evaluated:Patient Re-evaluated prior to induction Oxygen Delivery Method: Circle system utilized Preoxygenation: Pre-oxygenation with 100% oxygen Induction Type: IV induction and Rapid sequence Laryngoscope Size: Mac, 3 and Glidescope Grade View: Grade I Nasal Tubes: Right and Magill forceps- large, utilized Tube size: 7.0 mm Number of attempts: 1 Airway Equipment and Method: Video-laryngoscopy Placement Confirmation: ETT inserted through vocal cords under direct vision, positive ETCO2 and breath sounds checked- equal and bilateral Secured at: 27 cm Tube secured with: Tape Dental Injury: Teeth and Oropharynx as per pre-operative assessment

## 2020-10-27 NOTE — Progress Notes (Signed)
[facility] Radiation Oncology Dept Therapy Treatment Record Phone [phone]   Radiation Therapy was administered to [name] on: 10/27/2020  9:03 AM and was treatment # 1 out of a planned course of 1 treatments.  Radiation Treatment  1). Beam photons with 6-10 energy  2). Brachytherapy None  3). Stereotactic Radiosurgery None  4). Other Radiation None     Loura Pardon, RT (T)
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
## 2020-10-27 NOTE — Progress Notes (Signed)
Pt transfer out to [facility] for radiation therapy.

## 2020-10-27 NOTE — Anesthesia Postprocedure Evaluation (Signed)
Anesthesia Post Note  Patient: Andrew Simon  Procedure(s) Performed: OPEN REDUCTION INTERNAL FIXATION (ORIF) MANDIBULAR FRACTURE MMF     Patient location during evaluation: PACU Anesthesia Type: General Level of consciousness: awake and alert, oriented and patient cooperative Pain management: pain level controlled Vital Signs Assessment: post-procedure vital signs reviewed and stable Respiratory status: spontaneous breathing, nonlabored ventilation and respiratory function stable Cardiovascular status: blood pressure returned to baseline and stable Postop Assessment: no apparent nausea or vomiting Anesthetic complications: no   No notable events documented.  Last Vitals:    Last Pain:  Vitals:   10/27/20 1556  TempSrc:   PainSc: 5                  Lannie Fields

## 2020-10-28 LAB — BASIC METABOLIC PANEL
Anion gap: 9 (ref 5–15)
BUN: 10 mg/dL (ref 6–20)
CO2: 26 mmol/L (ref 22–32)
Calcium: 8.8 mg/dL — ABNORMAL LOW (ref 8.9–10.3)
Chloride: 102 mmol/L (ref 98–111)
Creatinine, Ser: 0.88 mg/dL (ref 0.61–1.24)
GFR, Estimated: >60 mL/min (ref >60)
Glucose, Bld: 120 mg/dL — ABNORMAL HIGH (ref 70–99)
Potassium: 4.2 mmol/L (ref 3.5–5.1)
Sodium: 137 mmol/L (ref 135–145)

## 2020-10-28 LAB — D-DIMER, QUANTITATIVE: D-Dimer, Quant: 4.13 ug/mL-FEU — ABNORMAL HIGH (ref 0.00–0.50)

## 2020-10-28 LAB — CULTURE, BLOOD (ROUTINE X 2)
Culture: NO GROWTH
Culture: NO GROWTH
Special Requests: ADEQUATE

## 2020-10-28 LAB — CBC
HCT: 31.5 % — ABNORMAL LOW (ref 39.0–52.0)
Hemoglobin: 11 g/dL — ABNORMAL LOW (ref 13.0–17.0)
MCH: 33.2 pg (ref 26.0–34.0)
MCHC: 34.9 g/dL (ref 30.0–36.0)
MCV: 95.2 fL (ref 80.0–100.0)
Platelets: 299 10*3/uL (ref 150–400)
RBC: 3.31 MIL/uL — ABNORMAL LOW (ref 4.22–5.81)
RDW: 12.3 % (ref 11.5–15.5)
WBC: 15.2 10*3/uL — ABNORMAL HIGH (ref 4.0–10.5)
nRBC: 0 % (ref 0.0–0.2)

## 2020-10-28 LAB — C-REACTIVE PROTEIN: CRP: 15.7 mg/dL — ABNORMAL HIGH (ref <1.0)
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
## 2020-10-30 ENCOUNTER — Encounter (HOSPITAL_COMMUNITY): Payer: Self-pay | Admitting: Otolaryngology

## 2020-10-30 ENCOUNTER — Other Ambulatory Visit (HOSPITAL_COMMUNITY): Payer: Self-pay

## 2020-10-30 MED ORDER — ORAL CARE MOUTH RINSE
15.0000 mL | Freq: Two times a day (BID) | OROMUCOSAL | 0 refills | Status: DC
  Filled 2020-10-30: qty 840, 28d supply, fill #0

## 2020-10-30 MED ORDER — APIXABAN 2.5 MG PO TABS
2.5000 mg | ORAL_TABLET | Freq: Two times a day (BID) | ORAL | 0 refills | Status: DC
  Filled 2020-10-30: qty 56, 28d supply, fill #0

## 2020-10-30 MED ORDER — CHOLECALCIFEROL 10 MCG/ML (400 UNIT/ML) PO LIQD
5000.0000 [IU] | Freq: Every day | ORAL | 1 refills | Status: DC
  Filled 2020-10-30: qty 375, 30d supply, fill #0

## 2020-10-30 MED ORDER — AMOXICILLIN-POT CLAVULANATE 250-62.5 MG/5ML PO SUSR
250.0000 mg | Freq: Two times a day (BID) | ORAL | 0 refills | Status: AC
  Filled 2020-10-30: qty 100, 10d supply, fill #0

## 2020-10-30 MED ORDER — METHOCARBAMOL 750 MG PO TABS
750.0000 mg | ORAL_TABLET | Freq: Three times a day (TID) | ORAL | 0 refills | Status: AC | PRN
  Filled 2020-10-30: qty 21, 7d supply, fill #0

## 2020-10-30 MED ORDER — DOCUSATE SODIUM 150 MG/15ML PO LIQD
100.0000 mg | Freq: Two times a day (BID) | ORAL | 0 refills | Status: DC | PRN
  Filled 2020-10-30: qty 100, 5d supply, fill #0

## 2020-10-30 MED ORDER — ACETAMINOPHEN 160 MG/5ML PO SUSP
1000.0000 mg | Freq: Four times a day (QID) | ORAL | 0 refills | Status: DC
  Filled 2020-10-30: qty 118, 2d supply, fill #0

## 2020-10-30 MED ORDER — CHOLECALCIFEROL 125 MCG (5000 UT) PO TABS
1.0000 | ORAL_TABLET | Freq: Every day | ORAL | 0 refills | Status: AC
  Filled 2020-10-30: qty 28, 28d supply, fill #0

## 2020-10-30 MED ORDER — OXYCODONE HCL 5 MG/5ML PO SOLN
10.0000 mg | Freq: Four times a day (QID) | ORAL | 0 refills | Status: AC | PRN
  Filled 2020-10-30: qty 280, 7d supply, fill #0
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
## 2020-10-30 NOTE — Progress Notes (Signed)
  Radiation Oncology         [phone] ________________________________  Name: Andrew Simon MRN: [id number]  Date: 10/27/2020  DOB: April 10, 1990  End of Treatment Note  Diagnosis:   Postoperative heterotopic ossification     Indication for treatment:  curative       Radiation treatment dates:   10/27/20  Site/dose:   The patient was treated to a dose of 7 Gy to the left hip. This was accomplished with AP and PA fields. Treatment was administered with clam shell style shielding of the testicles.   Narrative: The patient tolerated radiation treatment relatively well.   No difficulties in administration or immediate tolerance to treatment were noted.  Plan: The patient has completed radiation treatment. The patient will return to radiation oncology clinic on an as needed basis. ________________________________    Osker Mason, PAC
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
## 2020-10-30 NOTE — TOC Transition Note (Addendum)
Transition of Care [facility]) - CM/SW Discharge Note   Patient Details  Name: Andrew Simon MRN: [id number] Date of Birth: September 19, 1990  Transition of Care [facility]) CM/SW Contact:  Glennon Mac, RN Phone Number: 10/30/2020, 12:07 PM   Clinical Narrative:   Pt for possible discharge later today; he states he will have 24h supervision provided by his mother and step-father. PT recommending OP follow up, and pt agreeable to follow up.  Referral to [facility] for OP PT, as pt lives in [place].  Referral to [facility] for RW, to be delivered to bedside prior to dc. Pt declines 3 in 1, though recommended by OT.   Pt is uninsured, but is eligible for medication assistance through [facility] program. DC Rx to be sent to [facility] to be filled using MATCH letter.  Provided patient with information on [facility] for PCP follow up.    Final next level of care: OP Rehab Barriers to Discharge: Barriers Resolved   Patient Goals and CMS Choice Patient states their goals for this hospitalization and ongoing recovery are:: to get moving more                            Discharge Plan and Services   Discharge Planning Services: CM Consult            DME Arranged: [name] rolling DME Agency: [facility] Date DME Agency Contacted: 10/30/20 Time DME Agency Contacted: 1036 Representative spoke with at DME Agency: Velna Hatchet            Social Determinants of Health (SDOH) Interventions     Readmission Risk Interventions No flowsheet data found.  Quintella Baton, RN, BSN  Trauma/Neuro ICU Case Manager [phone]

## 2020-10-30 NOTE — Plan of Care (Signed)
  Problem: Clinical Measurements: Goal: Ability to maintain clinical measurements within normal limits will improve Outcome: Progressing Goal: Will remain free from infection Outcome: Progressing Goal: Diagnostic test results will improve Outcome: Progressing Goal: Respiratory complications will improve Outcome: Progressing Goal: Cardiovascular complication will be avoided Outcome: Progressing   Problem: Activity: Goal: Risk for activity intolerance will decrease Outcome: Progressing   Problem: Nutrition: Goal: Adequate nutrition will be maintained Outcome: Progressing   Problem: Coping: Goal: Level of anxiety will decrease Outcome: Progressing   Problem: Elimination: Goal: Will not experience complications related to bowel motility Outcome: Progressing Goal: Will not experience complications related to urinary retention Outcome: Progressing   Problem: Pain Managment: Goal: General experience of comfort will improve Outcome: Progressing   Problem: Safety: Goal: Ability to remain free from injury will improve Outcome: Progressing   Problem: Skin Integrity: Goal: Risk for impaired skin integrity will decrease Outcome: Progressing   Problem: Activity: Goal: Ability to ambulate and perform ADLs will improve Outcome: Progressing   Problem: Clinical Measurements: Goal: Postoperative complications will be avoided or minimized Outcome: Progressing   Problem: Self-Concept: Goal: Ability to maintain and perform role responsibilities to the fullest extent possible will improve Outcome: Progressing   Problem: Health Behavior/Discharge Planning: Goal: Ability to manage health-related needs will improve Outcome: Progressing   Problem: Education: Goal: Knowledge of General Education information will improve Description: Including pain rating scale, medication(s)/side effects and non-pharmacologic comfort measures Outcome: Progressing
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
## 2020-10-30 NOTE — Progress Notes (Signed)
Discharge information given to pt and mother at bedside. All questions answered, pt verbalized understanding of discharge material. IV removed, intact. Pt walker delivered to room by ortho. Pt medications delivered to room by [facility] pharmacy, medication education given to pt and mother. Medications handed to pt mother. RN transfer pt down to mother's car. Relinquishing care.

## 2020-10-31 ENCOUNTER — Encounter (HOSPITAL_COMMUNITY): Payer: Self-pay | Admitting: Orthopedic Surgery

## 2020-11-01 DIAGNOSIS — M898X9 Other specified disorders of bone, unspecified site: Secondary | ICD-10-CM | POA: Insufficient documentation
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
## 2020-11-07 ENCOUNTER — Telehealth: Payer: Self-pay

## 2020-11-07 ENCOUNTER — Other Ambulatory Visit: Payer: Self-pay | Admitting: Otolaryngology

## 2020-11-07 NOTE — Telephone Encounter (Signed)
Client newly enrolled into [facility] program. Client to establish Primary Medical care with [facility]. Appointment set for 11/14/20 at 1PM. Client states he has transportation to his appointment.    Also sent text of appointment and address and contact information of [facility].  Questions answered today. Client asked if provider would treat his history of ADHD. Client reports no medications since a teen. Discussed that provider would possibly refer him to [facility] for that treatment.  Client also asked if [facility] could assist in paying for Hydrocodone. Discussed that [employer] cannot prescribe medications or assist in paying for it. Client asked if [facility] would prescribe.  Client reports another provider surgeon or orthopedic may have prescribed. Encouraged for now to contact that provider for any need for refills

## 2020-11-10 ENCOUNTER — Encounter (HOSPITAL_COMMUNITY): Payer: Self-pay | Admitting: Otolaryngology

## 2020-11-13 ENCOUNTER — Telehealth (HOSPITAL_COMMUNITY): Payer: Self-pay

## 2020-11-13 ENCOUNTER — Other Ambulatory Visit (HOSPITAL_COMMUNITY): Payer: Self-pay
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
## 2020-11-14 ENCOUNTER — Other Ambulatory Visit: Payer: Self-pay

## 2020-11-14 ENCOUNTER — Ambulatory Visit: Payer: Self-pay | Admitting: Physician Assistant

## 2020-11-14 ENCOUNTER — Encounter: Payer: Self-pay | Admitting: Physician Assistant

## 2020-11-14 VITALS — BP 111/83 | HR 105 | Temp 98.8°F

## 2020-11-14 DIAGNOSIS — Z7689 Persons encountering health services in other specified circumstances: Secondary | ICD-10-CM

## 2020-11-14 DIAGNOSIS — Z2821 Immunization not carried out because of patient refusal: Secondary | ICD-10-CM

## 2020-11-14 DIAGNOSIS — S0993XD Unspecified injury of face, subsequent encounter: Secondary | ICD-10-CM

## 2020-11-14 DIAGNOSIS — F17200 Nicotine dependence, unspecified, uncomplicated: Secondary | ICD-10-CM

## 2020-11-14 DIAGNOSIS — F1910 Other psychoactive substance abuse, uncomplicated: Secondary | ICD-10-CM
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
## 2020-11-17 ENCOUNTER — Encounter (HOSPITAL_BASED_OUTPATIENT_CLINIC_OR_DEPARTMENT_OTHER): Payer: Self-pay | Admitting: Otolaryngology

## 2020-11-20 ENCOUNTER — Encounter (HOSPITAL_BASED_OUTPATIENT_CLINIC_OR_DEPARTMENT_OTHER): Payer: Self-pay | Admitting: Otolaryngology

## 2020-11-20 ENCOUNTER — Other Ambulatory Visit: Payer: Self-pay

## 2020-11-20 NOTE — Progress Notes (Signed)
Spoke with Dr. Hyacinth Meeker to see if he wants patient to stop Eliquis before surgery, but he said that is up to Dr. Suszanne Conners. Heather, at Dr. Avel Sensor office notified to contact patient if she wants him to stop before surgery.

## 2020-11-23 ENCOUNTER — Telehealth: Payer: Self-pay

## 2020-11-23 NOTE — Telephone Encounter (Signed)
Attempted call to follow up with [facility] client. No answer, left voicemail requesting return call  Francee Nodal RN Clara Gunn/[facility]

## 2020-11-24 ENCOUNTER — Encounter (HOSPITAL_BASED_OUTPATIENT_CLINIC_OR_DEPARTMENT_OTHER)
Admission: RE | Admit: 2020-11-24 | Discharge: 2020-11-24 | Disposition: A | Discharge status: Home / Self Care | Payer: Self-pay | Source: Ambulatory Visit | Attending: Otolaryngology | Admitting: Otolaryngology

## 2020-11-24 DIAGNOSIS — Z01812 Encounter for preprocedural laboratory examination: Secondary | ICD-10-CM | POA: Insufficient documentation

## 2020-11-24 LAB — COMPREHENSIVE METABOLIC PANEL
ALT: 12 U/L (ref 0–44)
AST: 20 U/L (ref 15–41)
Albumin: 3.8 g/dL (ref 3.5–5.0)
Alkaline Phosphatase: 64 U/L (ref 38–126)
Anion gap: 8 (ref 5–15)
BUN: 12 mg/dL (ref 6–20)
CO2: 26 mmol/L (ref 22–32)
Calcium: 9.4 mg/dL (ref 8.9–10.3)
Chloride: 106 mmol/L (ref 98–111)
Creatinine, Ser: 1.11 mg/dL (ref 0.61–1.24)
GFR, Estimated: >60 mL/min (ref >60)
Glucose, Bld: 83 mg/dL (ref 70–99)
Potassium: 4 mmol/L (ref 3.5–5.1)
Sodium: 140 mmol/L (ref 135–145)
Total Bilirubin: 0.4 mg/dL (ref 0.3–1.2)
Total Protein: 7.1 g/dL (ref 6.5–8.1)

## 2020-11-27 ENCOUNTER — Ambulatory Visit (HOSPITAL_BASED_OUTPATIENT_CLINIC_OR_DEPARTMENT_OTHER)
Admission: RE | Admit: 2020-11-27 | Discharge: 2020-11-27 | Disposition: A | Discharge status: Home / Self Care | Payer: Self-pay | Attending: Otolaryngology | Admitting: Otolaryngology

## 2020-11-27 ENCOUNTER — Encounter (HOSPITAL_BASED_OUTPATIENT_CLINIC_OR_DEPARTMENT_OTHER)
Admission: RE | Disposition: A | Discharge status: Home / Self Care | Payer: Self-pay | Source: Home / Self Care | Attending: Otolaryngology

## 2020-11-27 ENCOUNTER — Other Ambulatory Visit: Payer: Self-pay

## 2020-11-27 ENCOUNTER — Encounter (HOSPITAL_BASED_OUTPATIENT_CLINIC_OR_DEPARTMENT_OTHER): Payer: Self-pay | Admitting: Otolaryngology

## 2020-11-27 ENCOUNTER — Ambulatory Visit (HOSPITAL_BASED_OUTPATIENT_CLINIC_OR_DEPARTMENT_OTHER): Payer: Self-pay | Admitting: Anesthesiology

## 2020-11-27 DIAGNOSIS — Z472 Encounter for removal of internal fixation device: Secondary | ICD-10-CM | POA: Insufficient documentation

## 2020-11-27 DIAGNOSIS — Z79899 Other long term (current) drug therapy: Secondary | ICD-10-CM | POA: Insufficient documentation

## 2020-11-27 DIAGNOSIS — F17200 Nicotine dependence, unspecified, uncomplicated: Secondary | ICD-10-CM | POA: Diagnosis not present

## 2020-11-27 HISTORY — PX: MANDIBULAR HARDWARE REMOVAL: SHX5205

## 2020-11-27 SURGERY — REMOVAL, HARDWARE, MANDIBLE
Anesthesia: Monitor Anesthesia Care | Site: Mouth

## 2020-11-27 MED ORDER — ONDANSETRON HCL 4 MG/2ML IJ SOLN
4.0000 mg | Freq: Once | INTRAMUSCULAR | Status: DC | PRN
Start: 2020-11-27 — End: 2020-11-27

## 2020-11-27 MED ORDER — FENTANYL CITRATE (PF) 100 MCG/2ML IJ SOLN: 25.0000 ug | INTRAMUSCULAR | Status: DC | PRN

## 2020-11-27 MED ORDER — OXYCODONE HCL 5 MG/5ML PO SOLN
5.0000 mg | Freq: Once | ORAL | Status: DC | PRN
Start: 2020-11-27 — End: 2020-11-27

## 2020-11-27 MED ORDER — DEXMEDETOMIDINE (PRECEDEX) IN NS 20 MCG/5ML (4 MCG/ML) IV SYRINGE
PREFILLED_SYRINGE | INTRAVENOUS | Status: DC | PRN
  Administered 2020-11-27: 8 ug via INTRAVENOUS
  Administered 2020-11-27: 4 ug via INTRAVENOUS
  Administered 2020-11-27: 8 ug via INTRAVENOUS

## 2020-11-27 MED ORDER — MIDAZOLAM HCL 5 MG/5ML IJ SOLN
INTRAMUSCULAR | Status: DC | PRN
  Administered 2020-11-27 (×2): 1 mg via INTRAVENOUS

## 2020-11-27 MED ORDER — ONDANSETRON HCL 4 MG/2ML IJ SOLN
INTRAMUSCULAR | Status: DC | PRN
  Administered 2020-11-27: 4 mg via INTRAVENOUS

## 2020-11-27 MED ORDER — ACETAMINOPHEN 325 MG PO TABS: 325.0000 mg | ORAL_TABLET | ORAL | Status: DC | PRN

## 2020-11-27 MED ORDER — PROPOFOL 500 MG/50ML IV EMUL
INTRAVENOUS | Status: AC
  Filled 2020-11-27: qty 50

## 2020-11-27 MED ORDER — FENTANYL CITRATE (PF) 100 MCG/2ML IJ SOLN
INTRAMUSCULAR | Status: AC
  Filled 2020-11-27: qty 2

## 2020-11-27 MED ORDER — FENTANYL CITRATE (PF) 100 MCG/2ML IJ SOLN
INTRAMUSCULAR | Status: DC | PRN
  Administered 2020-11-27 (×2): 50 ug via INTRAVENOUS

## 2020-11-27 MED ORDER — PROPOFOL 500 MG/50ML IV EMUL
INTRAVENOUS | Status: DC | PRN
  Administered 2020-11-27: 75 ug/kg/min via INTRAVENOUS

## 2020-11-27 MED ORDER — LIDOCAINE-EPINEPHRINE 1 %-1:100000 IJ SOLN
INTRAMUSCULAR | Status: DC | PRN
  Administered 2020-11-27: 1 mL

## 2020-11-27 MED ORDER — MEPERIDINE HCL 25 MG/ML IJ SOLN: 6.2500 mg | INTRAMUSCULAR | Status: DC | PRN

## 2020-11-27 MED ORDER — ONDANSETRON HCL 4 MG/2ML IJ SOLN
INTRAMUSCULAR | Status: AC
  Filled 2020-11-27: qty 2

## 2020-11-27 MED ORDER — MIDAZOLAM HCL 2 MG/2ML IJ SOLN
INTRAMUSCULAR | Status: AC
  Filled 2020-11-27: qty 2

## 2020-11-27 MED ORDER — OXYCODONE HCL 5 MG PO TABS: 5.0000 mg | ORAL_TABLET | Freq: Once | ORAL | Status: DC | PRN

## 2020-11-27 MED ORDER — LACTATED RINGERS IV SOLN: INTRAVENOUS | Status: DC

## 2020-11-27 MED ORDER — ACETAMINOPHEN 160 MG/5ML PO SOLN: 325.0000 mg | ORAL | Status: DC | PRN

## 2020-11-27 MED ORDER — LIDOCAINE HCL (CARDIAC) PF 100 MG/5ML IV SOSY
PREFILLED_SYRINGE | INTRAVENOUS | Status: DC | PRN
  Administered 2020-11-27: 60 mg via INTRAVENOUS

## 2020-11-27 SURGICAL SUPPLY — 27 items
BLADE SURG 15 STRL LF DISP TIS (BLADE) ×1 IMPLANT
BLADE SURG 15 STRL SS (BLADE) ×2
CANISTER SUCT 1200ML W/VALVE (MISCELLANEOUS) ×2 IMPLANT
COVER MAYO STAND STRL (DRAPES) ×2 IMPLANT
DECANTER SPIKE VIAL GLASS SM (MISCELLANEOUS) ×1 IMPLANT
GAUZE 4X4 16PLY ~~LOC~~+RFID DBL (SPONGE) IMPLANT
GAUZE SPONGE 4X4 12PLY STRL LF (GAUZE/BANDAGES/DRESSINGS) IMPLANT
GLOVE SURG ENC MOIS LTX SZ7.5 (GLOVE) ×2 IMPLANT
GLOVE SURG POLYISO LF SZ6.5 (GLOVE) ×1 IMPLANT
GLOVE SURG UNDER POLY LF SZ7 (GLOVE) ×1 IMPLANT
GOWN STRL REUS W/ TWL LRG LVL3 (GOWN DISPOSABLE) ×2 IMPLANT
GOWN STRL REUS W/TWL LRG LVL3 (GOWN DISPOSABLE) ×4
MARKER SKIN DUAL TIP RULER LAB (MISCELLANEOUS) IMPLANT
NDL PRECISIONGLIDE 27X1.5 (NEEDLE) ×1 IMPLANT
NEEDLE PRECISIONGLIDE 27X1.5 (NEEDLE) ×2 IMPLANT
NS IRRIG 1000ML POUR BTL (IV SOLUTION) ×2 IMPLANT
PACK BASIN DAY SURGERY FS (CUSTOM PROCEDURE TRAY) ×2 IMPLANT
SCISSORS WIRE ANG 4 3/4 DISP (INSTRUMENTS) IMPLANT
SHEET MEDIUM DRAPE 40X70 STRL (DRAPES) ×2 IMPLANT
SUT CHROMIC 3 0 PS 2 (SUTURE) IMPLANT
SUT CHROMIC 4 0 PS 2 18 (SUTURE) IMPLANT
SUT CHROMIC 4 0 RB 1X27 (SUTURE) IMPLANT
SYR CONTROL 10ML LL (SYRINGE) ×2 IMPLANT
TOWEL GREEN STERILE FF (TOWEL DISPOSABLE) ×2 IMPLANT
TRAY DSU PREP LF (CUSTOM PROCEDURE TRAY) IMPLANT
TUBE CONNECTING 20X1/4 (TUBING) ×2 IMPLANT
YANKAUER SUCT BULB TIP NO VENT (SUCTIONS) ×1 IMPLANT

## 2020-11-27 NOTE — Transfer of Care (Signed)
Immediate Anesthesia Transfer of Care Note  Patient: Andrew Simon  Procedure(s) Performed: MANDIBULAR HARDWARE REMOVAL (Mouth)  Patient Location: PACU  Anesthesia Type:MAC  Level of Consciousness: sedated  Airway & Oxygen Therapy: Patient Spontanous Breathing and Patient connected to nasal cannula oxygen  Post-op Assessment: Report given to RN and Post -op Vital signs reviewed and stable  Post vital signs: Reviewed and stable  Last Vitals:  Vitals Value Taken Time  BP 110/75 11/27/20 1030  Temp 36.4 C 11/27/20 1028  Pulse 72 11/27/20 1031  Resp 4 11/27/20 1031  SpO2 96 % 11/27/20 1031  Vitals shown include unvalidated device data.  Last Pain:  Vitals:   11/27/20 0741  TempSrc: Oral  PainSc: 7          Complications: No notable events documented.

## 2020-11-27 NOTE — Discharge Instructions (Addendum)
  Post Anesthesia Home Care Instructions  Activity: Get plenty of rest for the remainder of the day. A responsible individual must stay with you for 24 hours following the procedure.  For the next 24 hours, DO NOT: -Drive a car -Operate machinery -Drink alcoholic beverages -Take any medication unless instructed by your physician -Make any legal decisions or sign important papers.  Meals: Start with liquid foods such as gelatin or soup. Progress to regular foods as tolerated. Avoid greasy, spicy, heavy foods. If nausea and/or vomiting occur, drink only clear liquids until the nausea and/or vomiting subsides. Call your physician if vomiting continues.  Special Instructions/Symptoms: Your throat may feel dry or sore from the anesthesia or the breathing tube placed in your throat during surgery. If this causes discomfort, gargle with warm salt water. The discomfort should disappear within 24 hours.  If you had a scopolamine patch placed behind your ear for the management of post- operative nausea and/or vomiting:  1. The medication in the patch is effective for 72 hours, after which it should be removed.  Wrap patch in a tissue and discard in the trash. Wash hands thoroughly with soap and water. 2. You may remove the patch earlier than 72 hours if you experience unpleasant side effects which may include dry mouth, dizziness or visual disturbances. 3. Avoid touching the patch. Wash your hands with soap and water after contact with the patch.    ------------  The patient may resume all his previous activities and diet.  He has no postop restriction. 

## 2020-11-27 NOTE — Anesthesia Postprocedure Evaluation (Signed)
Anesthesia Post Note  Patient: Andrew Simon  Procedure(s) Performed: MANDIBULAR HARDWARE REMOVAL (Mouth)     Patient location during evaluation: PACU Anesthesia Type: MAC Level of consciousness: awake and alert Pain management: pain level controlled Vital Signs Assessment: post-procedure vital signs reviewed and stable Respiratory status: spontaneous breathing, nonlabored ventilation, respiratory function stable and patient connected to nasal cannula oxygen Cardiovascular status: stable and blood pressure returned to baseline Postop Assessment: no apparent nausea or vomiting Anesthetic complications: no   No notable events documented.  Last Vitals:  Vitals:   11/27/20 1045 11/27/20 1053  BP: 108/78 109/81  Pulse: 68 74  Resp: 13 13  Temp:    SpO2: 96% 96%    Last Pain:  Vitals:   11/27/20 1028  TempSrc:   PainSc: Asleep                 Maimouna Rondeau
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
## 2020-12-04 ENCOUNTER — Encounter (HOSPITAL_BASED_OUTPATIENT_CLINIC_OR_DEPARTMENT_OTHER): Payer: Self-pay | Admitting: Otolaryngology

## 2020-12-10 ENCOUNTER — Other Ambulatory Visit: Payer: Self-pay | Admitting: Emergency Medicine

## 2020-12-10 ENCOUNTER — Other Ambulatory Visit: Payer: Self-pay | Admitting: Physician Assistant

## 2020-12-11 MED ORDER — IBUPROFEN 600 MG PO TABS: 600.0000 mg | ORAL_TABLET | Freq: Four times a day (QID) | ORAL | 0 refills | Status: DC | PRN

## 2020-12-13 ENCOUNTER — Ambulatory Visit: Payer: Self-pay | Admitting: Physician Assistant

## 2020-12-13 NOTE — H&P (Signed)
See H&P done on 11/27/20.

## 2020-12-26 ENCOUNTER — Encounter: Payer: Self-pay | Admitting: Physician Assistant

## 2020-12-26 ENCOUNTER — Ambulatory Visit: Payer: Self-pay | Admitting: Physician Assistant

## 2020-12-26 DIAGNOSIS — M25559 Pain in unspecified hip: Secondary | ICD-10-CM

## 2020-12-26 DIAGNOSIS — F1910 Other psychoactive substance abuse, uncomplicated: Secondary | ICD-10-CM

## 2020-12-26 DIAGNOSIS — S0993XD Unspecified injury of face, subsequent encounter: Secondary | ICD-10-CM

## 2020-12-26 DIAGNOSIS — F17200 Nicotine dependence, unspecified, uncomplicated: Secondary | ICD-10-CM

## 2020-12-26 NOTE — Progress Notes (Signed)
There were no vitals taken for this visit.   Subjective:    Patient ID: Andrew Simon, male    DOB: March 22, 1990, 30 y.o.   MRN: [id number]  HPI: Andrew Simon is a 30 y.o. male presenting on 12/26/2020 for No chief complaint on file.   HPI  This is a telemedicine appoitment through [employer].  Halfway through appointment, it was changed to telephone due to pt having signal problems.    I connected with  Andrew Simon on 12/26/20 by a video enabled telemedicine application and verified that I am speaking with the correct person using two identifiers.   I discussed the limitations of evaluation and management by telemedicine. The patient expressed understanding and agreed to proceed.  Pt is at home.  Provider is at office.    Pt is 30yoM who presents for follow up to his new pt appointment on 11/14/20.    He says he is doing pretty well.    He saw his orthopedic surgeon yesterday.  He says he is Still having problems in knees and hips and back.   He says he has trouble standing for more or so.   He has another follow up appointment with orthopedist on  01/30/21.    He is applying for medicaid- he submitted it on sept 27.  He hopes to get answer by December  The ENT, Dr Andrew Simon, removed his jaw hardware and pt says he has no more follow and He is done with dr Andrew Simon  He says he is no longer using hard drugs and is only using etoh (2d/wk he drinks 6- 12oz) and MJ     Relevant past medical, surgical, family and social history reviewed and updated as indicated. Interim medical history since our last visit reviewed. Allergies and medications reviewed and updated.    Current Outpatient Medications:    acetaminophen (TYLENOL) 500 MG tablet, Take 500-1,000 mg by mouth every 6 (six) hours as needed., Disp: , Rfl:    apixaban (ELIQUIS) 2.5 MG TABS tablet, Take 1 tablet (2.5 mg total) by mouth 2 (two) times daily for 28 days., Disp: 56 tablet, Rfl: 0   HYDROcodone-acetaminophen  (NORCO/VICODIN) 5-325 MG per tablet, Take 1 tablet by mouth every 4 (four) hours as needed for pain., Disp: 20 tablet, Rfl: 0   methocarbamol (ROBAXIN) 500 MG tablet, Take 1 tablet (500 mg total) by mouth 3 (three) times daily., Disp: 21 tablet, Rfl: 0    Review of Systems  Per HPI unless specifically indicated above     Objective:    There were no vitals taken for this visit.  Wt Readings from Last 3 Encounters:  11/27/20 156 lb 1.4 oz (70.8 kg)  10/22/20 180 lb 12.4 oz (82 kg)  12/02/18 168 lb (76.2 kg)    Physical Exam Constitutional:      General: He is not in acute distress.    Appearance: He is not toxic-appearing.  HENT:     Head: Normocephalic and atraumatic.  Neurological:     Mental Status: He is alert and oriented to person, place, and time.  Psychiatric:        Attention and Perception: Attention normal.        Speech: Speech normal.        Behavior: Behavior normal.           Assessment & Plan:    Encounter Diagnoses  Name Primary?   Hip pain Yes   Polysubstance abuse (HCC)  Tobacco use disorder    Dental injury, subsequent encounter       -Contact ortho to get info for PT and refer to PT -pt will be put on Dental list -pt counseled to avoid drinking more than 2/day and none is even better -pt agrees to covid and flu vaccinations and is scheduled to have this done next monday -will follow up 1 month to check in.  He is to contact office sooner prn

## 2021-01-18 ENCOUNTER — Ambulatory Visit (HOSPITAL_COMMUNITY): Payer: Self-pay | Attending: Physician Assistant | Admitting: Physical Therapy

## 2021-01-18 ENCOUNTER — Encounter (HOSPITAL_COMMUNITY): Payer: Self-pay | Admitting: Physical Therapy

## 2021-01-18 ENCOUNTER — Other Ambulatory Visit: Payer: Self-pay

## 2021-01-18 DIAGNOSIS — M25552 Pain in left hip: Secondary | ICD-10-CM | POA: Insufficient documentation

## 2021-01-18 DIAGNOSIS — M6281 Muscle weakness (generalized): Secondary | ICD-10-CM | POA: Insufficient documentation

## 2021-01-18 DIAGNOSIS — M25559 Pain in unspecified hip: Secondary | ICD-10-CM | POA: Insufficient documentation

## 2021-01-18 DIAGNOSIS — R262 Difficulty in walking, not elsewhere classified: Secondary | ICD-10-CM | POA: Insufficient documentation
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
## 2021-01-22 ENCOUNTER — Ambulatory Visit: Payer: Self-pay | Admitting: Physician Assistant

## 2021-01-22 ENCOUNTER — Encounter: Payer: Self-pay | Admitting: Physician Assistant

## 2021-01-22 DIAGNOSIS — M25559 Pain in unspecified hip: Secondary | ICD-10-CM

## 2021-01-22 NOTE — Progress Notes (Signed)
   There were no vitals taken for this visit.   Subjective:    Patient ID: Andrew Simon, male    DOB: 1990-04-17, 30 y.o.   MRN: [id number]  HPI: Andrew Simon is a 30 y.o. male presenting on 01/22/2021 for No chief complaint on file.   HPI  This is a telemedicine appointment through [employer]  I connected with  Andrew Simon on 01/22/21 by a video enabled telemedicine application and verified that I am speaking with the correct person using two identifiers.   I discussed the limitations of evaluation and management by telemedicine. The patient expressed understanding and agreed to proceed.  Pt is at home.  Provider is at office.    Pt is Doing Physical therapy.  He says he's only been once. He Is still seeing orthopedics He says he has no new problems   Relevant past medical, surgical, family and social history reviewed and updated as indicated. Interim medical history since our last visit reviewed. Allergies and medications reviewed and updated.   Current Outpatient Medications:    acetaminophen (TYLENOL) 500 MG tablet, Take 500-1,000 mg by mouth every 6 (six) hours as needed., Disp: , Rfl:    HYDROcodone-acetaminophen (NORCO/VICODIN) 5-325 MG per tablet, Take 1 tablet by mouth every 4 (four) hours as needed for pain., Disp: 20 tablet, Rfl: 0   methocarbamol (ROBAXIN) 500 MG tablet, Take 1 tablet (500 mg total) by mouth 3 (three) times daily., Disp: 21 tablet, Rfl: 0     Review of Systems  Per HPI unless specifically indicated above     Objective:    There were no vitals taken for this visit.  Wt Readings from Last 3 Encounters:  11/27/20 156 lb 1.4 oz (70.8 kg)  10/22/20 180 lb 12.4 oz (82 kg)  12/02/18 168 lb (76.2 kg)    Physical Exam Constitutional:      General: He is not in acute distress.    Appearance: He is not ill-appearing.  HENT:     Head: Normocephalic and atraumatic.  Pulmonary:     Effort: No respiratory distress.     Comments: Pt is  talking in complete sentences without dyspnea Neurological:     Mental Status: He is alert and oriented to person, place, and time.  Psychiatric:        Behavior: Behavior normal.         Assessment & Plan:   Encounter Diagnosis  Name Primary?   Hip pain Yes     -pt to continue physical therapy -pt educated and encouraged to get Covid vaccination -pt to follow up 6 months.  He is to contact office sooner prn

## 2021-01-29 ENCOUNTER — Ambulatory Visit (HOSPITAL_COMMUNITY): Payer: Self-pay | Admitting: Physical Therapy

## 2021-01-29 ENCOUNTER — Telehealth (HOSPITAL_COMMUNITY): Payer: Self-pay | Admitting: Physical Therapy

## 2021-01-29 NOTE — Telephone Encounter (Signed)
Pt did not show for appointment.  Called and left voicemail regarding missed appointment and reminder for next appointment.   Lurena Nida, PTA/CLT, Margarita Rana [phone]

## 2021-02-01 ENCOUNTER — Ambulatory Visit (HOSPITAL_COMMUNITY): Payer: Self-pay | Admitting: Physical Therapy

## 2021-02-01 ENCOUNTER — Other Ambulatory Visit: Payer: Self-pay

## 2021-02-01 DIAGNOSIS — M25552 Pain in left hip: Secondary | ICD-10-CM

## 2021-02-01 DIAGNOSIS — M6281 Muscle weakness (generalized): Secondary | ICD-10-CM

## 2021-02-01 DIAGNOSIS — R262 Difficulty in walking, not elsewhere classified: Secondary | ICD-10-CM

## 2021-02-01 NOTE — Patient Instructions (Signed)
Extension    Lift leg up in the air and bring it back down. Repeat __10__ times. Do __2__ sessions per day.   Abduction: Side Leg Lift (Eccentric) - Side-Lying    Lie on side. Lift top leg slightly higher than shoulder level. Keep top leg straight with body, toes pointing forward. Slowly lower for 3-5 seconds. __10_ reps per set, _2__ sets per day, _7__ days per week.    Straight Leg Raise    Tighten stomach and slowly raise locked right leg _15___ inches from floor.Repeat __10__ times per set.  Do 2____ sessions per day.    Bridge [employer] small of back into mat, maintain pelvic tilt, roll up one vertebrae at a time. Focus on engaging posterior hip muscles. Hold for __5__ seconds. Repeat __10__ times.  Copyright  VHI. All rights reserved.
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
## 2021-02-05 ENCOUNTER — Ambulatory Visit (HOSPITAL_COMMUNITY): Payer: Self-pay | Admitting: Physical Therapy

## 2021-02-06 ENCOUNTER — Ambulatory Visit (HOSPITAL_COMMUNITY): Payer: Self-pay

## 2021-02-07 ENCOUNTER — Ambulatory Visit (HOSPITAL_COMMUNITY): Payer: Self-pay | Admitting: Physical Therapy

## 2021-02-14 ENCOUNTER — Ambulatory Visit (HOSPITAL_COMMUNITY): Payer: Self-pay | Admitting: Physical Therapy

## 2021-02-20 ENCOUNTER — Ambulatory Visit (HOSPITAL_COMMUNITY): Payer: Self-pay | Attending: Physician Assistant

## 2021-02-22 ENCOUNTER — Ambulatory Visit (HOSPITAL_COMMUNITY): Payer: Self-pay | Admitting: Physical Therapy

## 2021-02-26 ENCOUNTER — Telehealth (HOSPITAL_COMMUNITY): Payer: Self-pay

## 2021-02-26 ENCOUNTER — Ambulatory Visit (HOSPITAL_COMMUNITY): Payer: Self-pay

## 2021-02-26 NOTE — Telephone Encounter (Signed)
No call, no show.  8:40 AM, 02/26/21 M. Sherlyn Lees, PT, DPT Physical Therapist- [place] Office Number: [phone]

## 2021-03-08 ENCOUNTER — Telehealth (HOSPITAL_COMMUNITY): Payer: Self-pay | Admitting: Physical Therapy

## 2021-03-08 ENCOUNTER — Ambulatory Visit (HOSPITAL_COMMUNITY): Payer: Self-pay | Admitting: Physical Therapy

## 2021-03-08 NOTE — Telephone Encounter (Signed)
Pt did not show, 2nd NS.  Pt has need been to therapy since December 15.  Cancelled all remaining appts following his next one on 1/30 per NS policy.  Left message regarding this change and reminder for next appt.  Lurena Nida, PTA/CLT, Margarita Rana [phone]

## 2021-03-14 ENCOUNTER — Encounter (HOSPITAL_COMMUNITY): Payer: Self-pay

## 2021-03-14 NOTE — Therapy (Signed)
[facility] [place], [place], [address] Phone: [phone]   Fax:  [phone]  Patient Details  Name: Andrew Simon MRN: [id number] Date of Birth: 1990-08-26 Referring Provider:  No ref. provider found  Encounter Date: 03/14/2021  PHYSICAL THERAPY DISCHARGE SUMMARY  Visits from Start of Care: 2  Current functional level related to goals / functional outcomes: Patient has not returned since last visit and no-call, no show for subsequent appointments   Remaining deficits: N/A   Education / Equipment: HEP initiated   Patient agrees to discharge. Patient goals were not met. Patient is being discharged due to not returning since the last visit.  Toniann Fail, PT 03/14/2021, 2:25 PM  [facility] [address] [place], [place], [address] Phone: [phone]   Fax:  [phone]

## 2021-03-19 ENCOUNTER — Ambulatory Visit (HOSPITAL_COMMUNITY): Payer: Self-pay

## 2021-03-22 ENCOUNTER — Encounter (HOSPITAL_COMMUNITY): Payer: Self-pay | Admitting: Physical Therapy

## 2021-03-26 ENCOUNTER — Encounter (HOSPITAL_COMMUNITY): Payer: Self-pay | Admitting: Physical Therapy

## 2021-07-02 ENCOUNTER — Ambulatory Visit: Payer: Self-pay | Admitting: Physician Assistant
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
## 2022-02-12 ENCOUNTER — Encounter (HOSPITAL_COMMUNITY): Payer: Self-pay

## 2022-02-26 ENCOUNTER — Emergency Department (HOSPITAL_COMMUNITY): Payer: Self-pay

## 2022-02-26 ENCOUNTER — Emergency Department (HOSPITAL_COMMUNITY)
Admission: EM | Admit: 2022-02-26 | Discharge: 2022-02-26 | Disposition: A | Discharge status: Home / Self Care | Payer: Self-pay | Attending: Emergency Medicine | Admitting: Emergency Medicine

## 2022-02-26 ENCOUNTER — Other Ambulatory Visit: Payer: Self-pay

## 2022-02-26 DIAGNOSIS — M79641 Pain in right hand: Secondary | ICD-10-CM | POA: Insufficient documentation

## 2022-02-26 DIAGNOSIS — M7981 Nontraumatic hematoma of soft tissue: Secondary | ICD-10-CM | POA: Insufficient documentation

## 2022-02-26 LAB — CBC WITH DIFFERENTIAL/PLATELET
Abs Immature Granulocytes: 0.02 10*3/uL (ref 0.00–0.07)
Basophils Absolute: 0.1 10*3/uL (ref 0.0–0.1)
Basophils Relative: 1 %
Eosinophils Absolute: 0.3 10*3/uL (ref 0.0–0.5)
Eosinophils Relative: 5 %
HCT: 45 % (ref 39.0–52.0)
Hemoglobin: 15.3 g/dL (ref 13.0–17.0)
Immature Granulocytes: 0 %
Lymphocytes Relative: 31 %
Lymphs Abs: 2 10*3/uL (ref 0.7–4.0)
MCH: 32.4 pg (ref 26.0–34.0)
MCHC: 34 g/dL (ref 30.0–36.0)
MCV: 95.3 fL (ref 80.0–100.0)
Monocytes Absolute: 0.8 10*3/uL (ref 0.1–1.0)
Monocytes Relative: 12 %
Neutro Abs: 3.3 10*3/uL (ref 1.7–7.7)
Neutrophils Relative %: 51 %
Platelets: 244 10*3/uL (ref 150–400)
RBC: 4.72 MIL/uL (ref 4.22–5.81)
RDW: 13.2 % (ref 11.5–15.5)
WBC: 6.6 10*3/uL (ref 4.0–10.5)
nRBC: 0 % (ref 0.0–0.2)

## 2022-02-26 LAB — COMPREHENSIVE METABOLIC PANEL
ALT: 21 U/L (ref 0–44)
AST: 29 U/L (ref 15–41)
Albumin: 4.1 g/dL (ref 3.5–5.0)
Alkaline Phosphatase: 62 U/L (ref 38–126)
Anion gap: 10 (ref 5–15)
BUN: 14 mg/dL (ref 6–20)
CO2: 25 mmol/L (ref 22–32)
Calcium: 9.5 mg/dL (ref 8.9–10.3)
Chloride: 100 mmol/L (ref 98–111)
Creatinine, Ser: 1.06 mg/dL (ref 0.61–1.24)
GFR, Estimated: >60 mL/min (ref >60)
Glucose, Bld: 112 mg/dL — ABNORMAL HIGH (ref 70–99)
Potassium: 4 mmol/L (ref 3.5–5.1)
Sodium: 135 mmol/L (ref 135–145)
Total Bilirubin: 0.4 mg/dL (ref 0.3–1.2)
Total Protein: 8.1 g/dL (ref 6.5–8.1)

## 2022-02-26 LAB — LACTIC ACID, PLASMA: Lactic Acid, Venous: 1.8 mmol/L (ref 0.5–1.9)

## 2022-02-26 NOTE — ED Provider Notes (Signed)
[facility] EMERGENCY DEPARTMENT Provider Note   CSN: [id number] Arrival date & time: 02/26/22  1001     History  Chief Complaint  Patient presents with   Hand Pain    Andrew Simon is a 32 y.o. male.  32 y.o male with no PMH presents to the ED with a chief complaint of right hand pain which has been ongoing x 1 month. Patient reports he suffered from frostbite approximately 1 month ago, evaluated at [facility] ER and referred to hand specialist, however reports that patient has not had any follow-up, according to notes he has not been following up with physical therapy either.  He endorses pain to the area, described as severe.  Reports no underlying diabetes.  No systemic signs.  The history is provided by the patient and medical records.  Hand Pain Pertinent negatives include no chest pain, no abdominal pain, no headaches and no shortness of breath.       Home Medications Prior to Admission medications   Medication Sig Start Date End Date Taking? Authorizing Provider  acetaminophen (TYLENOL) 500 MG tablet Take 500-1,000 mg by mouth every 6 (six) hours as needed.    [provider]  HYDROcodone-acetaminophen (NORCO/VICODIN) 5-325 MG per tablet Take 1 tablet by mouth every 4 (four) hours as needed for pain. 04/29/12   Ivery Quale, PA-C  methocarbamol (ROBAXIN) 500 MG tablet Take 1 tablet (500 mg total) by mouth 3 (three) times daily. 04/29/12   Ivery Quale, PA-C      Allergies    Iodine    Review of Systems   Review of Systems  Constitutional:  Negative for fever.  Respiratory:  Negative for shortness of breath.   Cardiovascular:  Negative for chest pain.  Gastrointestinal:  Negative for abdominal pain.  Genitourinary:  Negative for flank pain.  Musculoskeletal:  Positive for arthralgias. Negative for back pain.  Skin:  Positive for color change and wound.  Neurological:  Negative for light-headedness and headaches.  All other systems  reviewed and are negative.   Physical Exam Updated Vital Signs BP (!) 149/104 (BP Location: Right Arm)   Pulse 91   Temp 98.6 F (37 C)   Resp 16   Ht 5\' 9"  (1.753 m)   Wt 73.9 kg   SpO2 99%   BMI 24.07 kg/m  Physical Exam Vitals and nursing note reviewed.  Constitutional:      Appearance: Normal appearance.  HENT:     Head: Normocephalic and atraumatic.     Mouth/Throat:     Mouth: Mucous membranes are moist.  Eyes:     Pupils: Pupils are equal, round, and reactive to light.  Cardiovascular:     Rate and Rhythm: Normal rate.  Pulmonary:     Effort: Pulmonary effort is normal.     Breath sounds: No wheezing.  Abdominal:     General: Abdomen is flat.  Musculoskeletal:     Cervical back: Normal range of motion and neck supple.  Skin:    General: Skin is warm.     Findings: Bruising and wound present. No burn, erythema, signs of injury or lesion.  Neurological:     Mental Status: He is alert and oriented to person, place, and time.     ED Results / Procedures / Treatments   Labs (all labs ordered are listed, but only abnormal results are displayed) Labs Reviewed  COMPREHENSIVE METABOLIC PANEL - Abnormal; Notable for the following components:      Result  Value   Glucose, Bld 112 (*)    All other components within normal limits  CBC WITH DIFFERENTIAL/PLATELET  LACTIC ACID, PLASMA  LACTIC ACID, PLASMA    EKG None  Radiology DG Hand Complete Right  Result Date: 02/26/2022 CLINICAL DATA:  Pain.  Recent frostbite EXAM: RIGHT HAND - COMPLETE 3+ VIEW COMPARISON:  None Available. FINDINGS: No acute fracture or dislocation. Soft tissue irregularity about the palmar and radial aspect of the distal third digit, possibly a site of frostbite. No soft tissue gas or radiopaque foreign object. More subtle/equivocal osseous irregularity about the distal second digit. IMPRESSION: Soft tissue abnormalities about the third and likely second digits could be correlated with physical  exam. No underlying osseous abnormality identified. Electronically Signed   By: Jeronimo Greaves M.D.   On: 02/26/2022 10:57    Procedures Procedures    Medications Ordered in ED Medications - No data to display  ED Course/ Medical Decision Making/ A&P                           Medical Decision Making Amount and/or Complexity of Data Reviewed Labs: ordered. Radiology: ordered.    This patient presents to the ED for concern of right hand pain, this involves a number of treatment options, and is a complaint that carries with it a high risk of complications and morbidity.  The differential diagnosis includes trauma, neuropathy, ischemia versus infection.    Co morbidities: Discussed in HPI   Brief History:  Patient here with frostbite x 1 month, no follow up, no PT per records review. No systemic signs.   EMR reviewed including pt PMHx, past surgical history and past visits to ER.   See HPI for more details   Lab Tests:  I ordered and independently interpreted labs.  The pertinent results include:    I personally reviewed all laboratory work and imaging. Metabolic panel without any acute abnormality specifically kidney function within normal limits and no significant electrolyte abnormalities. CBC without leukocytosis or significant anemia.   Imaging Studies:  NAD. I personally reviewed all imaging studies and no acute abnormality found. I agree with radiology interpretation.  Consults:  I requested consultation with orthopedics on call,  and discussed lab and imaging findings as well as pertinent plan - they recommend: outpatient follow up as patient is not septic or diabetic and has not had treatment in a month.   Reevaluation:  After the interventions noted above I re-evaluated patient and found that they have :stayed the same   Social Determinants of Health:  The patient's social determinants of health were a factor in the care of this patient  Problem List /  ED Course:  Patient here with underlying frostbite that occurred approximately 1 month ago, he has not had any follow-up since this first injury.  He is reporting pain to the right hand, exacerbated with any type of movement.  Compartments are soft, during the monitor he is in no distress, vital signs are within normal limits.  He does report this occurred at work, while he was under extreme temperatures.  There is no capillary refill and some concern for dry gangrene.  No streaking in the skin to suggest a cellulitis.  Labs are within normal limits for leukocytosis, his lactic acid is negative.  X-ray of his hand did not show any osteomyelitis.  I discussed case with orthopedics on-call who are aware patient is not septic at this time and  agree with outpatient follow-up.  Patient is overall in stable condition, we discussed pain management with over-the-counter medication.  Patient is agreeable to plan treatment, stable for discharge.  Dispostion:  After consideration of the diagnostic results and the patients response to treatment, I feel that the patent would benefit from outpatient follow up with hand specialist.    Portions of this note were generated with Dragon dictation software. Dictation errors may occur despite best attempts at proofreading.   Final Clinical Impression(s) / ED Diagnoses Final diagnoses:  Right hand pain    Rx / DC Orders ED Discharge Orders     None         Janeece Fitting, PA-C 02/26/22 1257    Godfrey Pick, MD 02/27/22 1622

## 2022-02-26 NOTE — ED Triage Notes (Addendum)
Pt. Stated, I have frost bit to my right fingers . I saw a DR in [facility] ER and said I need to see a hand specialist. I have not seen one and my hand is bothering me. This happened on my job.

## 2022-02-26 NOTE — Discharge Instructions (Addendum)
YOU NEED TO MAKE AN APPOINTMENT WITH THE HAND SPECIALIST IN ORDER TO HAVE APPROPRIATE FOLLOW UP FOR YOUR HAND WOUND.  Please schedule an appointment, their contact information is attached to your chart.

## 2022-02-26 NOTE — ED Provider Triage Note (Signed)
Emergency Medicine Provider Triage Evaluation Note  Andrew Simon , a 32 y.o. male  was evaluated in triage.  Pt complains of right hand pain.  Suffered from frostbite at his job approximately a month ago, reports worsening pain to his fingers.  He was told to follow-up with a hand specialist but has not done so, no underlying diabetes that he reports.  Loss in sensation to the tip of his fingers.  No fevers or systemic signs.  Review of Systems  Positive: Hand pain Negative: fever  Physical Exam  BP (!) 149/104 (BP Location: Right Arm)   Pulse 91   Temp 98.6 F (37 C)   Resp 16   Ht 5\' 9"  (1.753 m)   Wt 73.9 kg   SpO2 99%   BMI 24.07 kg/m  Gen:   Awake, no distress   Resp:  Normal effort  MSK:   Moves extremities without difficulty  Other:  2+ radial pulse, ischemia to the distal aspect with no cap refill. Please see photo attached   Medical Decision Making  Medically screening exam initiated at 10:12 AM.  Appropriate orders placed.  Andrew Simon was informed that the remainder of the evaluation will be completed by another provider, this initial triage assessment does not replace that evaluation, and the importance of remaining in the ED until their evaluation is complete.  Basic blood work ordered   Janeece Fitting, PA-C 02/26/22 1014
